# Patient Record
Sex: Male | Born: 1970
Health system: Southern US, Community
[De-identification: ages and names within clinical notes are randomized; demographics above are authoritative.]

## PROBLEM LIST (undated history)

## (undated) DIAGNOSIS — G4730 Sleep apnea, unspecified: Secondary | ICD-10-CM

## (undated) DIAGNOSIS — F1910 Other psychoactive substance abuse, uncomplicated: Secondary | ICD-10-CM

## (undated) DIAGNOSIS — F32A Depression, unspecified: Secondary | ICD-10-CM

## (undated) DIAGNOSIS — F329 Major depressive disorder, single episode, unspecified: Secondary | ICD-10-CM

## (undated) DIAGNOSIS — B001 Herpesviral vesicular dermatitis: Secondary | ICD-10-CM

## (undated) DIAGNOSIS — F419 Anxiety disorder, unspecified: Secondary | ICD-10-CM

## (undated) HISTORY — DX: Major depressive disorder, single episode, unspecified: F32.9

## (undated) HISTORY — DX: Depression, unspecified: F32.A

## (undated) HISTORY — DX: Anxiety disorder, unspecified: F41.9

## (undated) HISTORY — DX: Herpesviral vesicular dermatitis: B00.1

## (undated) HISTORY — PX: HERNIA REPAIR: SHX51

## (undated) HISTORY — DX: Sleep apnea, unspecified: G47.30

---

## 1990-02-24 HISTORY — PX: ANTERIOR CRUCIATE LIGAMENT REPAIR: SHX115

## 2005-01-01 ENCOUNTER — Ambulatory Visit: Payer: Self-pay | Admitting: Family Medicine

## 2006-01-18 ENCOUNTER — Emergency Department (HOSPITAL_COMMUNITY): Admission: EM | Admit: 2006-01-18 | Discharge: 2006-01-18 | Payer: Self-pay | Admitting: Family Medicine

## 2006-05-12 ENCOUNTER — Ambulatory Visit: Payer: Self-pay | Admitting: Family Medicine

## 2006-08-11 ENCOUNTER — Emergency Department (HOSPITAL_COMMUNITY): Admission: EM | Admit: 2006-08-11 | Discharge: 2006-08-11 | Payer: Self-pay | Admitting: Emergency Medicine

## 2006-08-11 ENCOUNTER — Telehealth (INDEPENDENT_AMBULATORY_CARE_PROVIDER_SITE_OTHER): Payer: Self-pay | Admitting: *Deleted

## 2007-03-31 ENCOUNTER — Ambulatory Visit: Payer: Self-pay | Admitting: Family Medicine

## 2007-03-31 DIAGNOSIS — F329 Major depressive disorder, single episode, unspecified: Secondary | ICD-10-CM

## 2007-03-31 DIAGNOSIS — Q549 Hypospadias, unspecified: Secondary | ICD-10-CM | POA: Insufficient documentation

## 2007-03-31 DIAGNOSIS — J069 Acute upper respiratory infection, unspecified: Secondary | ICD-10-CM | POA: Insufficient documentation

## 2008-01-28 ENCOUNTER — Ambulatory Visit: Payer: Self-pay | Admitting: Internal Medicine

## 2008-01-28 DIAGNOSIS — B9789 Other viral agents as the cause of diseases classified elsewhere: Secondary | ICD-10-CM | POA: Insufficient documentation

## 2008-02-01 ENCOUNTER — Telehealth (INDEPENDENT_AMBULATORY_CARE_PROVIDER_SITE_OTHER): Payer: Self-pay | Admitting: Internal Medicine

## 2008-02-03 ENCOUNTER — Ambulatory Visit: Payer: Self-pay | Admitting: Family Medicine

## 2008-02-11 ENCOUNTER — Telehealth (INDEPENDENT_AMBULATORY_CARE_PROVIDER_SITE_OTHER): Payer: Self-pay | Admitting: Internal Medicine

## 2008-02-11 ENCOUNTER — Encounter (INDEPENDENT_AMBULATORY_CARE_PROVIDER_SITE_OTHER): Payer: Self-pay | Admitting: Internal Medicine

## 2008-02-11 ENCOUNTER — Ambulatory Visit (HOSPITAL_COMMUNITY): Admission: RE | Admit: 2008-02-11 | Discharge: 2008-02-11 | Payer: Self-pay | Admitting: Family Medicine

## 2008-02-29 ENCOUNTER — Ambulatory Visit: Payer: Self-pay | Admitting: Family Medicine

## 2008-02-29 DIAGNOSIS — M549 Dorsalgia, unspecified: Secondary | ICD-10-CM | POA: Insufficient documentation

## 2009-02-14 ENCOUNTER — Ambulatory Visit: Payer: Self-pay | Admitting: Family Medicine

## 2009-02-14 DIAGNOSIS — B070 Plantar wart: Secondary | ICD-10-CM

## 2009-02-14 DIAGNOSIS — J019 Acute sinusitis, unspecified: Secondary | ICD-10-CM | POA: Insufficient documentation

## 2009-03-30 ENCOUNTER — Encounter: Payer: Self-pay | Admitting: Family Medicine

## 2010-03-26 NOTE — Letter (Signed)
Summary: [facility]  [facility]   Imported By: Edmonia James 04/05/2009 10:28:19  _____________________________________________________________________  External Attachment:    Type:   Image     Comment:   External Document

## 2010-07-15 ENCOUNTER — Telehealth: Payer: Self-pay | Admitting: *Deleted

## 2010-07-15 ENCOUNTER — Encounter: Payer: Self-pay | Admitting: Family Medicine

## 2010-07-15 ENCOUNTER — Ambulatory Visit (INDEPENDENT_AMBULATORY_CARE_PROVIDER_SITE_OTHER): Payer: 59 | Admitting: Family Medicine

## 2010-07-15 ENCOUNTER — Ambulatory Visit (INDEPENDENT_AMBULATORY_CARE_PROVIDER_SITE_OTHER)
Admission: RE | Admit: 2010-07-15 | Discharge: 2010-07-15 | Disposition: A | Discharge status: Home / Self Care | Payer: 59 | Source: Ambulatory Visit | Attending: Family Medicine | Admitting: Family Medicine

## 2010-07-15 VITALS — BP 116/72 | HR 76 | Temp 97.5°F | Ht 72.0 in | Wt 200.0 lb

## 2010-07-15 DIAGNOSIS — M545 Low back pain, unspecified: Secondary | ICD-10-CM

## 2010-07-15 MED ORDER — HYDROCODONE-ACETAMINOPHEN 5-500 MG PO TABS: 1.0000 | ORAL_TABLET | Freq: Four times a day (QID) | ORAL | Status: DC | PRN

## 2010-07-15 MED ORDER — METHOCARBAMOL 500 MG PO TABS: 500.0000 mg | ORAL_TABLET | Freq: Four times a day (QID) | ORAL | Status: AC | PRN

## 2010-07-15 NOTE — Assessment & Plan Note (Signed)
Recurrent - suspect weak musculature and spasm Some sciatic symptoms on R LS film today Robaxin and naproxen vicodin if needed Start PT exercises given at last visit  Update Considering chiropractor  Declines PT at this time Red flags- weakness/ numbness-will call

## 2010-07-15 NOTE — Patient Instructions (Signed)
Work on your back exercises at home X ray today  Trial of robaxin , also vicodin-- watch for sedation Continue heat  Stay in motion as much as you can but no heavy lifting  We will update you with result

## 2010-07-15 NOTE — Telephone Encounter (Signed)
Triage Record Num: [id number] Operator: Leota Sauers Patient Name: Eric Blackburn Call Date & Time: 07/14/2010 1:04:08PM Patient Phone: [phone] PCP: Patient Gender: Male PCP Fax : Patient DOB: 12-30-1970 Practice Name: [facility] Reason for Call: Pt. calling about lower back pain; onset 5/12. States he has been taking Naproxen 500 up to TID, ice and then heat. ES Tylenol TID for pain. Pain rated up to 9/10 - shooting pains; dull ache at 4 in between. Per standing orders called in Flexeril 5 mg TID until next business day. Uses [facility] [phone]. Advised home care measures and to follow up w/ PCP within 24 hours per Back Sx. protocol. Protocol(s) Used: Back Symptoms Recommended Outcome per Protocol: See Provider within 24 hours Reason for Outcome: Back pain radiates into thigh or below knee Care Advice: ~ Avoid heavy lifting, bending and twisting of the back, and prolonged sitting until evaluated by provider. Apply a cloth-covered cold or ice pack to the area for 20 minutes 4 to 8 times a day for relief of pain for the first 24-48 hours. After 24 to 48 hours of cold application, use a cloth-covered heat pack to the area for 20 minutes 3 to 4 times a day. ~ Go to the ED if you have worsening pain, numbness or weakness of arms or legs, cannot walk, or have new unexplained changes in bladder or bowel function. Another adult should drive. ~ ~ Avoid activity that causes or worsens symptoms. 07/14/2010 1:20:29PM Page 1 of 1 [id number]

## 2010-07-15 NOTE — Progress Notes (Signed)
Subjective:    Patient ID: Eric Blackburn, male    DOB: [birth year], 40 y.o.   MRN: [id number]  HPI Here for recurrence of low back pain  Happened after lifting a game table on 5/12   --it hurt right away - not agonizing- but got worse  This is lasting longer than usual  This has been a week and not improving at all   No numb/ weak More pains down his leg-- this time R side  More shooting / stabbing pains-- wraps around to low abd  Sitting and then standing - stiff and getting up in am  Back with knees bent is the best  No bowel or bladder change   Does not do physical tx exercises at home Lost 8-10 lb  Doing sit ups and push ups - past year   Using heating pad and lying on floor - cannot find a comfortable position   Has had low back pain on and off for years  Last saw dr Lorelei Pont in winter tx with home PT exercises and flexeril/ diclofenac/ vicodin  Has not had x ray of his back   Chiropractor helped years ago    Has been taking naproxen 500 tid  Flexeril not really helping --- in past robaxin- ? If worked better  [employer] some tramadol at work   Past Medical History  Diagnosis Date  . Depression     History   Social History  . Marital Status: Married    Spouse Name: N/A    Number of Children: N/A  . Years of Education: N/A   Occupational History  . Not on file.   Social History Main Topics  . Smoking status: Never Smoker   . Smokeless tobacco: Not on file  . Alcohol Use: Not on file  . Drug Use: Not on file  . Sexually Active: Not on file   Other Topics Concern  . Not on file   Social History Narrative  . No narrative on file         Review of Systems Review of Systems  Constitutional: Negative for fever, appetite change, fatigue and unexpected weight change.  Eyes: Negative for pain and visual disturbance.  Respiratory: Negative for cough and shortness of breath.   Cardiovascular: Negative.for cp   Gastrointestinal: Negative for nausea,  diarrhea and constipation.  Genitourinary: Negative for urgency and frequency.  Skin: Negative for pallor.or rash  MSK pos for back and leg pain, no joint swelling  Neurological: Negative for weakness, light-headedness, numbness and headaches.  Hematological: Negative for adenopathy. Does not bruise/bleed easily.  Psychiatric/Behavioral: Negative for dysphoric mood. The patient is not nervous/anxious.          Objective:   Physical Exam  Constitutional: He appears well-developed and well-nourished. No distress.  HENT:  Head: Normocephalic and atraumatic.  Eyes: Conjunctivae and EOM are normal. Pupils are equal, round, and reactive to light.  Neck: Normal range of motion. Neck supple. No thyromegaly present.       No CS tenderness   Cardiovascular: Normal rate, regular rhythm and normal heart sounds.   Pulmonary/Chest: Effort normal and breath sounds normal. No respiratory distress. He has no wheezes.  Abdominal: He exhibits no mass. There is no tenderness.  Musculoskeletal: He exhibits no edema and no tenderness.       No LS tenderness Flex 20 deg and ext 10 deg Pain to flex R and twist R Some tenderness of R perilumbar and buttock musculature SLR causes lumbar  pain on R No neurol deficits  Lymphadenopathy:    He has no cervical adenopathy.  Neurological: He is alert. He has normal strength and normal reflexes. No sensory deficit.  Skin: Skin is warm and dry. No rash noted. No erythema. No pallor.  Psychiatric: He has a normal mood and affect.          Assessment & Plan:

## 2010-07-18 ENCOUNTER — Telehealth: Payer: Self-pay | Admitting: Family Medicine

## 2010-07-18 DIAGNOSIS — M545 Low back pain, unspecified: Secondary | ICD-10-CM

## 2010-07-18 NOTE — Telephone Encounter (Signed)
Message copied by Abner Greenspan on Thu Jul 18, 2010  4:39 PM ------      Message from: Royann Shivers      Created: Thu Jul 18, 2010  2:25 PM       Patient notified and is fine with going to ortho. He didn't have a preference as he didn't really know who specialized in backs. I told him we would do referral and give him a call. He verbalized understanding.

## 2010-07-18 NOTE — Telephone Encounter (Signed)
Will do ref for [employer]

## 2010-07-19 ENCOUNTER — Telehealth: Payer: Self-pay | Admitting: *Deleted

## 2010-07-19 NOTE — Telephone Encounter (Signed)
I doubt that - but the orthopedic Dr will be able to answer that question more accurately after reviewing his films No obvious signs of old fracture

## 2010-07-19 NOTE — Telephone Encounter (Signed)
Patient notified as instructed by telephone. Pt wondered if there was any fracture at all new or old. Dr Glori Bickers said none seen and the defect in the spine could possible be from trauma that slid the disc forward over the other and Dr Glori Bickers wanted him to see orthopedist to see if anything could be done to help pain possible PT. Pt was appreciative and will wait to see ortho.

## 2010-07-19 NOTE — Telephone Encounter (Signed)
Pt called with question regarding x-ray results.  He is asking if he has an old fracture that has healed.

## 2010-07-19 NOTE — Telephone Encounter (Signed)
Appt made Dr Lorin Mercy on 07/25/2010 at 3:30 pt notified. Eric Blackburn

## 2010-09-11 ENCOUNTER — Other Ambulatory Visit: Payer: Self-pay | Admitting: *Deleted

## 2010-09-11 MED ORDER — VALACYCLOVIR HCL 1 G PO TABS: ORAL_TABLET | ORAL | Status: DC

## 2010-09-11 NOTE — Telephone Encounter (Signed)
Patient notified as instructed by telephone. 

## 2010-09-11 NOTE — Telephone Encounter (Signed)
Sent electronically 

## 2010-09-11 NOTE — Telephone Encounter (Signed)
Patient says that he has a couple of cold sores and is asking if he could get this refilled. Uses [facility].

## 2010-12-11 LAB — POCT RAPID STREP A: Streptococcus, Group A Screen (Direct): NEGATIVE

## 2011-02-03 ENCOUNTER — Encounter (HOSPITAL_COMMUNITY): Payer: Self-pay | Admitting: Emergency Medicine

## 2011-02-03 ENCOUNTER — Other Ambulatory Visit: Payer: Self-pay | Admitting: Internal Medicine

## 2011-02-03 ENCOUNTER — Emergency Department (HOSPITAL_COMMUNITY)
Admission: EM | Admit: 2011-02-03 | Discharge: 2011-02-03 | Disposition: A | Discharge status: Home / Self Care | Payer: 59 | Source: Home / Self Care

## 2011-02-03 DIAGNOSIS — M545 Low back pain: Secondary | ICD-10-CM

## 2011-02-03 MED ORDER — CYCLOBENZAPRINE HCL 10 MG PO TABS: 10.0000 mg | ORAL_TABLET | Freq: Three times a day (TID) | ORAL | Status: DC | PRN

## 2011-02-03 MED ORDER — KETOROLAC TROMETHAMINE 60 MG/2ML IM SOLN
60.0000 mg | Freq: Once | INTRAMUSCULAR | Status: AC
  Administered 2011-02-03: 60 mg via INTRAMUSCULAR

## 2011-02-03 MED ORDER — CYCLOBENZAPRINE HCL 10 MG PO TABS: 10.0000 mg | ORAL_TABLET | Freq: Three times a day (TID) | ORAL | Status: AC | PRN

## 2011-02-03 MED ORDER — OXYCODONE-ACETAMINOPHEN 5-325 MG PO TABS: 1.0000 | ORAL_TABLET | ORAL | Status: AC | PRN

## 2011-02-03 MED ORDER — KETOROLAC TROMETHAMINE 60 MG/2ML IM SOLN
INTRAMUSCULAR | Status: AC
  Filled 2011-02-03: qty 2

## 2011-02-03 MED ORDER — NAPROXEN 500 MG PO TABS: 500.0000 mg | ORAL_TABLET | Freq: Two times a day (BID) | ORAL | Status: DC

## 2011-02-03 NOTE — ED Notes (Signed)
Pt here with bialt lower back pain shooting down bialt legs more on right sharp constant achy pain that flared up x 2dys ago.pt ahs hx chronic back pain diag with pars defect x 65mnths ago seen by dr.yates orthopedic and given prescribed pain meds.sx worsens with pressure and bending.pt has been taking naprosyn and using heating pad for discomfort and percocet.

## 2011-02-03 NOTE — Telephone Encounter (Signed)
Patient notified as instructed by telephone. Pt said he has already been seen at [facility] and got Flexeril and pain med. I advised pt to give [place] a call if we could be of assistance. Pt did not want med called in.

## 2011-02-03 NOTE — Telephone Encounter (Signed)
Patient called and stated he strained his lower back again and is having trouble sleeping and is in a lot of pain.  He wanted to know if Rx could be called in for pain and muscle relaxer.  Patient is at Urgent care and he stated it is crowded and if we can get this done and then call him on his cell.  Please advise.

## 2011-02-03 NOTE — Telephone Encounter (Signed)
I can do a muscle relaxer to get him by until he can get appt here  Cannot do narcotic however Will try flexeril with caution Naproxen prn bid with food  Warm compresses prn

## 2011-02-03 NOTE — ED Provider Notes (Signed)
History     CSN: [id number] Arrival date & time: 02/03/2011  1:43 PM   None     Chief Complaint  Patient presents with  . Back Pain    (Consider location/radiation/quality/duration/timing/severity/associated sxs/prior treatment) HPI Comments: Hx of LBP. Was diagnosed with Pars Defect by Dr Lorin Mercy. Pain returned 2 days ago - same pain as has had previously. Pt states he had done some heavy lifting. States he has a flare up of his back pain a couple times a year. In the past has taken Hydrocodone (or Percocet)  and Robaxin (or Flexeril)  and has worked well for him. This weekend was using Percocet and also provided relief. Pain radiates down bilat legs, Rt > Lt. No numbness or tingling. No loss of bowel or bladder control.   Patient is a 40 y.o. male presenting with back pain. The history is provided by the patient.  Back Pain  Pertinent negatives include no chest pain, no fever, no abdominal pain and no dysuria.    Past Medical History  Diagnosis Date  . Depression     Past Surgical History  Procedure Date  . Hernia repair     x 2 as a child  . Anterior cruciate ligament repair 1992    Family History  Problem Relation Age of Onset  . Depression Mother     History  Substance Use Topics  . Smoking status: Never Smoker   . Smokeless tobacco: Not on file  . Alcohol Use: Yes      Review of Systems  Constitutional: Negative for fever and chills.  Respiratory: Negative for cough and shortness of breath.   Cardiovascular: Negative for chest pain.  Gastrointestinal: Negative for abdominal pain, diarrhea and constipation.  Genitourinary: Negative for dysuria and frequency.  Musculoskeletal: Positive for back pain.    Allergies  Review of patient's allergies indicates no known allergies.  Home Medications   Current Outpatient Rx  Name Route Sig Dispense Refill  . BUPROPION HCL ER (XL) 300 MG PO TB24 Oral Take 300 mg by mouth daily.      Marland Kitchen CALCIUM-VITAMIN D3 500-400  MG-UNIT PO TABS Oral Take 1 tablet by mouth daily.      . CYCLOBENZAPRINE HCL 10 MG PO TABS Oral Take 1 tablet (10 mg total) by mouth every 8 (eight) hours as needed for muscle spasms. 30 tablet 0  . ESCITALOPRAM OXALATE 10 MG PO TABS Oral Take 10 mg by mouth daily.      Marland Kitchen ESCITALOPRAM OXALATE 20 MG PO TABS Oral Take 20 mg by mouth daily.      Marland Kitchen HYDROCODONE-ACETAMINOPHEN 5-500 MG PO TABS Oral Take 1 tablet by mouth every 6 (six) hours as needed for pain (watch for sedation). 30 tablet 0  . MULTIVITAMINS PO CAPS Oral Take 1 capsule by mouth daily.      Marland Kitchen NAPROXEN 500 MG PO TABS Oral Take 1 tablet (500 mg total) by mouth 2 (two) times daily with a meal. 60 tablet 0  . FISH OIL 1000 MG PO CAPS Oral Take 1 capsule by mouth daily.     Marland Kitchen VALACYCLOVIR HCL 1 G PO TABS  2 tablets by mouth two times a day for 1 day for cold sore as needed. 4 tablet 3    BP 114/79  Pulse 74  Temp(Src) 97.7 F (36.5 C) (Oral)  Resp 24  SpO2 99%  Physical Exam  Nursing note and vitals reviewed. Constitutional: He is oriented to person, place, and time. He  appears well-developed and well-nourished.       NAD, but does appear uncomfortable and is pacing the exam room.   Cardiovascular: Normal rate, regular rhythm and normal heart sounds.   Pulmonary/Chest: Effort normal and breath sounds normal. No respiratory distress.  Musculoskeletal:       Lumbar back: He exhibits decreased range of motion and tenderness. He exhibits no bony tenderness, no swelling and no spasm.       Back:  Neurological: He is alert and oriented to person, place, and time. He has normal strength. No sensory deficit. Gait normal.  Reflex Scores:      Patellar reflexes are 2+ on the right side and 2+ on the left side. Skin: Skin is warm and dry.  Psychiatric: He has a normal mood and affect.    ED Course  Procedures (including critical care time)  Labs Reviewed - No data to display No results found.   No diagnosis found.    [place], [place] 02/03/11 [phone]

## 2011-02-04 NOTE — ED Provider Notes (Signed)
Dx low back pain, sent home with percocet, flexaril. Is to f/u with Dr. Narda Amber Medical screening examination/treatment/procedure(s) were performed by non-physician practitioner and as supervising physician I was immediately available for consultation/collaboration.  Cherly Beach MD   Cherly Beach, MD 02/04/11 1800

## 2012-01-27 ENCOUNTER — Other Ambulatory Visit: Payer: Self-pay | Admitting: *Deleted

## 2012-01-27 MED ORDER — VALACYCLOVIR HCL 1 G PO TABS: ORAL_TABLET | ORAL | Status: DC

## 2012-01-27 NOTE — Telephone Encounter (Signed)
Rx filled and pt notified  

## 2012-01-27 NOTE — Telephone Encounter (Signed)
Please give 5 refils-thanks

## 2012-01-27 NOTE — Telephone Encounter (Signed)
Pt came to do the pharm. inspection and asked me to see if you would refill med, please advise

## 2013-01-03 ENCOUNTER — Encounter: Payer: Self-pay | Admitting: Family Medicine

## 2013-04-25 ENCOUNTER — Other Ambulatory Visit: Payer: 59

## 2013-05-02 ENCOUNTER — Encounter: Payer: 59 | Admitting: Family Medicine

## 2013-05-16 ENCOUNTER — Telehealth: Payer: Self-pay | Admitting: Family Medicine

## 2013-05-16 DIAGNOSIS — Z0000 Encounter for general adult medical examination without abnormal findings: Secondary | ICD-10-CM | POA: Insufficient documentation

## 2013-05-16 NOTE — Telephone Encounter (Signed)
Message copied by Abner Greenspan on Mon May 16, 2013  6:11 PM ------      Message from: Ellamae Sia      Created: Mon May 09, 2013 12:12 PM      Regarding: Lab orders for Wednesday, 3.25.15       Patient is scheduled for CPX labs, please order future labs, Thanks , Terri       ------

## 2013-05-18 ENCOUNTER — Other Ambulatory Visit (INDEPENDENT_AMBULATORY_CARE_PROVIDER_SITE_OTHER): Payer: 59

## 2013-05-18 DIAGNOSIS — Z0000 Encounter for general adult medical examination without abnormal findings: Secondary | ICD-10-CM

## 2013-05-18 LAB — COMPREHENSIVE METABOLIC PANEL
ALBUMIN: 4.2 g/dL (ref 3.5–5.2)
ALT: 25 U/L (ref 0–53)
AST: 24 U/L (ref 0–37)
Alkaline Phosphatase: 44 U/L (ref 39–117)
BILIRUBIN TOTAL: 0.6 mg/dL (ref 0.3–1.2)
BUN: 11 mg/dL (ref 6–23)
CALCIUM: 9.2 mg/dL (ref 8.4–10.5)
CO2: 29 mEq/L (ref 19–32)
Chloride: 102 mEq/L (ref 96–112)
Creatinine, Ser: 1 mg/dL (ref 0.4–1.5)
GFR: 90.46 mL/min (ref >60.00)
GLUCOSE: 96 mg/dL (ref 70–99)
Potassium: 4.2 mEq/L (ref 3.5–5.1)
Sodium: 136 mEq/L (ref 135–145)
Total Protein: 6.8 g/dL (ref 6.0–8.3)

## 2013-05-18 LAB — LIPID PANEL
Cholesterol: 182 mg/dL (ref 0–200)
HDL: 60.5 mg/dL (ref >39.00)
LDL Cholesterol: 113 mg/dL — ABNORMAL HIGH (ref 0–99)
TRIGLYCERIDES: 44 mg/dL (ref 0.0–149.0)
Total CHOL/HDL Ratio: 3
VLDL: 8.8 mg/dL (ref 0.0–40.0)

## 2013-05-18 LAB — CBC WITH DIFFERENTIAL/PLATELET
BASOS PCT: 0.5 % (ref 0.0–3.0)
Basophils Absolute: 0 10*3/uL (ref 0.0–0.1)
Eosinophils Absolute: 0.1 10*3/uL (ref 0.0–0.7)
Eosinophils Relative: 2.7 % (ref 0.0–5.0)
HEMATOCRIT: 40.8 % (ref 39.0–52.0)
HEMOGLOBIN: 13.4 g/dL (ref 13.0–17.0)
LYMPHS ABS: 1.5 10*3/uL (ref 0.7–4.0)
LYMPHS PCT: 32.3 % (ref 12.0–46.0)
MCHC: 32.9 g/dL (ref 30.0–36.0)
MCV: 93 fl (ref 78.0–100.0)
MONO ABS: 0.4 10*3/uL (ref 0.1–1.0)
MONOS PCT: 9 % (ref 3.0–12.0)
NEUTROS ABS: 2.6 10*3/uL (ref 1.4–7.7)
Neutrophils Relative %: 55.5 % (ref 43.0–77.0)
PLATELETS: 212 10*3/uL (ref 150.0–400.0)
RBC: 4.38 Mil/uL (ref 4.22–5.81)
RDW: 13.3 % (ref 11.5–14.6)
WBC: 4.6 10*3/uL (ref 4.5–10.5)

## 2013-05-18 LAB — TSH: TSH: 1.39 u[IU]/mL (ref 0.35–5.50)

## 2013-05-25 ENCOUNTER — Ambulatory Visit (INDEPENDENT_AMBULATORY_CARE_PROVIDER_SITE_OTHER): Payer: 59 | Admitting: Family Medicine

## 2013-05-25 ENCOUNTER — Encounter: Payer: Self-pay | Admitting: Family Medicine

## 2013-05-25 VITALS — BP 110/70 | HR 62 | Temp 97.9°F | Ht 71.14 in | Wt 200.8 lb

## 2013-05-25 DIAGNOSIS — F329 Major depressive disorder, single episode, unspecified: Secondary | ICD-10-CM

## 2013-05-25 DIAGNOSIS — Z23 Encounter for immunization: Secondary | ICD-10-CM

## 2013-05-25 DIAGNOSIS — G4710 Hypersomnia, unspecified: Secondary | ICD-10-CM

## 2013-05-25 DIAGNOSIS — F3289 Other specified depressive episodes: Secondary | ICD-10-CM

## 2013-05-25 DIAGNOSIS — R0609 Other forms of dyspnea: Secondary | ICD-10-CM

## 2013-05-25 DIAGNOSIS — G4733 Obstructive sleep apnea (adult) (pediatric): Secondary | ICD-10-CM | POA: Insufficient documentation

## 2013-05-25 DIAGNOSIS — R0989 Other specified symptoms and signs involving the circulatory and respiratory systems: Secondary | ICD-10-CM

## 2013-05-25 DIAGNOSIS — G4730 Sleep apnea, unspecified: Secondary | ICD-10-CM

## 2013-05-25 DIAGNOSIS — R0683 Snoring: Secondary | ICD-10-CM

## 2013-05-25 DIAGNOSIS — Z0000 Encounter for general adult medical examination without abnormal findings: Secondary | ICD-10-CM

## 2013-05-25 NOTE — Progress Notes (Signed)
Subjective:    Patient ID: Eric Blackburn, male    DOB: Aug 05, 1970, 43 y.o.   MRN: [id number]  HPI Here for health maintenance exam and to review chronic medical problems    Has been doing well   Nothing new going on   ? When last Td was  Had flu shot in the fall   Mood has been really good - stable on current medicines for quite a while - he titrated dose down a bit on his own  He has been stable for a while and would like me to take over px his antidep meds when they run out next time   Results for orders placed in visit on 05/18/13  CBC WITH DIFFERENTIAL      Result Value Ref Range   WBC 4.6  4.5 - 10.5 K/uL   RBC 4.38  4.22 - 5.81 Mil/uL   Hemoglobin 13.4  13.0 - 17.0 g/dL   HCT 40.8  39.0 - 52.0 %   MCV 93.0  78.0 - 100.0 fl   MCHC 32.9  30.0 - 36.0 g/dL   RDW 13.3  11.5 - 14.6 %   Platelets 212.0  150.0 - 400.0 K/uL   Neutrophils Relative % 55.5  43.0 - 77.0 %   Lymphocytes Relative 32.3  12.0 - 46.0 %   Monocytes Relative 9.0  3.0 - 12.0 %   Eosinophils Relative 2.7  0.0 - 5.0 %   Basophils Relative 0.5  0.0 - 3.0 %   Neutro Abs 2.6  1.4 - 7.7 K/uL   Lymphs Abs 1.5  0.7 - 4.0 K/uL   Monocytes Absolute 0.4  0.1 - 1.0 K/uL   Eosinophils Absolute 0.1  0.0 - 0.7 K/uL   Basophils Absolute 0.0  0.0 - 0.1 K/uL  COMPREHENSIVE METABOLIC PANEL      Result Value Ref Range   Sodium 136  135 - 145 mEq/L   Potassium 4.2  3.5 - 5.1 mEq/L   Chloride 102  96 - 112 mEq/L   CO2 29  19 - 32 mEq/L   Glucose, Bld 96  70 - 99 mg/dL   BUN 11  6 - 23 mg/dL   Creatinine, Ser 1.0  0.4 - 1.5 mg/dL   Total Bilirubin 0.6  0.3 - 1.2 mg/dL   Alkaline Phosphatase 44  39 - 117 U/L   AST 24  0 - 37 U/L   ALT 25  0 - 53 U/L   Total Protein 6.8  6.0 - 8.3 g/dL   Albumin 4.2  3.5 - 5.2 g/dL   Calcium 9.2  8.4 - 10.5 mg/dL   GFR 90.46  >60.00 mL/min  LIPID PANEL      Result Value Ref Range   Cholesterol 182  0 - 200 mg/dL   Triglycerides 44.0  0.0 - 149.0 mg/dL   HDL 60.50  >39.00  mg/dL   VLDL 8.8  0.0 - 40.0 mg/dL   LDL Cholesterol 113 (*) 0 - 99 mg/dL   Total CHOL/HDL Ratio 3    TSH      Result Value Ref Range   TSH 1.39  0.35 - 5.50 uIU/mL    HDL is very good - he does some dog walking 2 miles per day and getting ready for a big backpacking trip  Diet is ok - avoids fast food and eating healthier at home   MGF had prostate cancer  No frequent urination  No nocturia at all  Has hx of hypospadias -since birth - sees Dr Jeffie Pollock / has narrow urethra   Wt is stable   Drinks beer - has a drink every night / more on the weekends   Snores a lot  His wife recorded him  He has a big uvula  He has had witnessed apnea Is tired all the time  Waking up un rested   Patient Active Problem List   Diagnosis Date Noted  . Snoring 05/25/2013  . Hypersomnia with sleep apnea, unspecified 05/25/2013  . Routine general medical examination at a health care facility 05/16/2013  . LOW BACK PAIN, ACUTE 02/29/2008  . DEPRESSION 03/31/2007  . HYPOSPADIAS 03/31/2007   Past Medical History  Diagnosis Date  . Depression    Past Surgical History  Procedure Laterality Date  . Hernia repair      x 2 as a child  . Anterior cruciate ligament repair  1992   History  Substance Use Topics  . Smoking status: Never Smoker   . Smokeless tobacco: Never Used  . Alcohol Use: Yes     Comment: 1-2 drinks daily   Family History  Problem Relation Age of Onset  . Depression Mother    No Known Allergies Current Outpatient Prescriptions on File Prior to Visit  Medication Sig Dispense Refill  . escitalopram (LEXAPRO) 10 MG tablet Take 10 mg by mouth daily.        . Multiple Vitamin (MULTIVITAMIN) capsule Take 1 capsule by mouth every Monday, Wednesday, and Friday.       . naproxen (NAPROSYN) 500 MG tablet Take 1 tablet (500 mg total) by mouth 2 (two) times daily with a meal.  60 tablet  0  . valACYclovir (VALTREX) 1000 MG tablet 2 tablets by mouth two times a day for 1 day for cold  sore as needed.  4 tablet  5   No current facility-administered medications on file prior to visit.    Review of Systems Review of Systems  Constitutional: Negative for fever, appetite change,  and unexpected weight change. pos for fatigue/unrestful sleep and witnessed apnea Eyes: Negative for pain and visual disturbance.  Respiratory: Negative for cough and shortness of breath.  pos for snoring  Cardiovascular: Negative for cp or palpitations    Gastrointestinal: Negative for nausea, diarrhea and constipation.  Genitourinary: Negative for urgency and frequency. neg for nocturia  Skin: Negative for pallor or rash   Neurological: Negative for weakness, light-headedness, numbness and headaches.  Hematological: Negative for adenopathy. Does not bruise/bleed easily.  Psychiatric/Behavioral: Negative for dysphoric mood. The patient is not nervous/anxious.         Objective:   Physical Exam  Constitutional: He appears well-developed and well-nourished. No distress.  HENT:  Head: Normocephalic and atraumatic.  Right Ear: External ear normal.  Left Ear: External ear normal.  Nose: Nose normal.  Mouth/Throat: Oropharynx is clear and moist.  Eyes: Conjunctivae and EOM are normal. Pupils are equal, round, and reactive to light. Right eye exhibits no discharge. Left eye exhibits no discharge. No scleral icterus.  Neck: Normal range of motion. Neck supple. No JVD present. Carotid bruit is not present. No thyromegaly present.  Cardiovascular: Normal rate, regular rhythm, normal heart sounds and intact distal pulses.  Exam reveals no gallop.   Pulmonary/Chest: Effort normal and breath sounds normal. No respiratory distress. He has no wheezes. He exhibits no tenderness.  Abdominal: Soft. Bowel sounds are normal. He exhibits no distension, no abdominal bruit and no mass. There is  no tenderness.  Musculoskeletal: He exhibits no edema and no tenderness.  Lymphadenopathy:    He has no cervical  adenopathy.  Neurological: He is alert. He has normal reflexes. No cranial nerve deficit. He exhibits normal muscle tone. Coordination normal.  Skin: Skin is warm and dry. No rash noted. No erythema. No pallor.  Psychiatric: He has a normal mood and affect.  Cheerful and talkative           Assessment & Plan:

## 2013-05-25 NOTE — Progress Notes (Signed)
Pre visit review using our clinic review tool, if applicable. No additional management support is needed unless otherwise documented below in the visit note. 

## 2013-05-25 NOTE — Patient Instructions (Addendum)
Tetanus shot today  Take care of yourself  Labs are stable  Avoid red meat/ fried foods/ egg yolks/ fatty breakfast meats/ butter, cheese and high fat dairy/ and shellfish   Please stop up front on the way out for your sleep clinic referral

## 2013-05-26 ENCOUNTER — Other Ambulatory Visit: Payer: Self-pay

## 2013-05-26 MED ORDER — VALACYCLOVIR HCL 1 G PO TABS
ORAL_TABLET | ORAL | Status: DC
Start: 2013-05-26 — End: 2013-06-05

## 2013-05-26 NOTE — Assessment & Plan Note (Signed)
With witnessed apnea and fatigue  Ref to sleep clinic-eval for sleep apnea

## 2013-05-26 NOTE — Assessment & Plan Note (Signed)
Reviewed health habits including diet and exercise and skin cancer prevention Reviewed appropriate screening tests for age  Also reviewed health mt list, fam hx and immunization status , as well as social and family history   Labs reviewed  

## 2013-05-26 NOTE — Assessment & Plan Note (Signed)
Ref to sleep clinic in pulm office to disc sleep study  Symptoms consistent with apnea and wife has witnessed it

## 2013-05-26 NOTE — Telephone Encounter (Signed)
Pt left v/m requesting refill valtrex to [facility].Please advise. Dr Glori Bickers is possibley not on computer. Pt request cb when refilled.

## 2013-05-26 NOTE — Telephone Encounter (Signed)
plz notify sent in. 

## 2013-05-26 NOTE — Assessment & Plan Note (Signed)
Prev seen by psychiatry- but now stable on lower doses of ssri and buproprion When his current supply runs out we will take over px  No problems-doing well emph imp of tx of sleep apnea and also self care

## 2013-05-30 NOTE — Telephone Encounter (Signed)
Patient notified by telephone that script has been sent to the pharmacy. 

## 2013-06-05 ENCOUNTER — Telehealth: Payer: Self-pay | Admitting: Family Medicine

## 2013-06-05 MED ORDER — VALACYCLOVIR HCL 1 G PO TABS: ORAL_TABLET | ORAL | Status: DC

## 2013-06-05 NOTE — Telephone Encounter (Signed)
Pt needs refill of valtrex to [facility]

## 2013-07-04 ENCOUNTER — Encounter: Payer: Self-pay | Admitting: Family Medicine

## 2013-07-05 MED ORDER — BUPROPION HCL ER (XL) 150 MG PO TB24: 150.0000 mg | ORAL_TABLET | Freq: Every day | ORAL | Status: DC

## 2013-07-05 MED ORDER — ESCITALOPRAM OXALATE 10 MG PO TABS: 10.0000 mg | ORAL_TABLET | Freq: Every day | ORAL | Status: DC

## 2013-07-05 NOTE — Telephone Encounter (Signed)
Will refill electronically  

## 2013-07-07 ENCOUNTER — Encounter: Payer: Self-pay | Admitting: Internal Medicine

## 2013-07-07 ENCOUNTER — Ambulatory Visit (INDEPENDENT_AMBULATORY_CARE_PROVIDER_SITE_OTHER): Payer: 59 | Admitting: Internal Medicine

## 2013-07-07 VITALS — BP 118/70 | HR 67 | Ht 72.0 in | Wt 206.8 lb

## 2013-07-07 DIAGNOSIS — G4730 Sleep apnea, unspecified: Principal | ICD-10-CM

## 2013-07-07 DIAGNOSIS — G4710 Hypersomnia, unspecified: Secondary | ICD-10-CM

## 2013-07-07 DIAGNOSIS — G4733 Obstructive sleep apnea (adult) (pediatric): Secondary | ICD-10-CM

## 2013-07-07 NOTE — Patient Instructions (Signed)
Order- Unattended home sleep study    Dx OSA  Please call as needed

## 2013-07-07 NOTE — Progress Notes (Signed)
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 No sleep study His wife tells him he snores loudly; he wakes himself ER and worse on his back. Weight is stable. Daytime sleepiness-2 or 3 caffeine drinks per day. Bedtime 10 or 11 PM, sleep latency 20 minutes, waking 4 or 5 times before up at 5:45 AM No leg jerks or other unusual activity. No ENT surgery. Good general health.  Prior to Admission medications   Medication Sig Start Date End Date Taking? Authorizing Provider  buPROPion (WELLBUTRIN XL) 150 MG 24 hr tablet Take 1 tablet (150 mg total) by mouth daily. 07/05/13  Yes Abner Greenspan, MD  escitalopram (LEXAPRO) 10 MG tablet Take 1 tablet (10 mg total) by mouth daily. 07/05/13  Yes Abner Greenspan, MD  ibuprofen (ADVIL,MOTRIN) 200 MG tablet Take 400-800 mg by mouth every 6 (six) hours as needed.   Yes Historical Provider, MD  Multiple Vitamin (MULTIVITAMIN) capsule Take 1 capsule by mouth every Monday, Wednesday, and Friday.    Yes Historical Provider, MD  valACYclovir (VALTREX) 1000 MG tablet 2 tablets by mouth two times a day for 1 day for cold sore as needed. 06/05/13  Yes Abner Greenspan, MD   Past Medical History  Diagnosis Date  . Depression    Past Surgical History  Procedure Laterality Date  . Hernia repair      x 2 as a child  . Anterior cruciate ligament repair  1992    Right   Family History  Problem Relation Age of Onset  . Depression Mother   . Emphysema Maternal Grandfather   . Asthma Sister     developed as a child  . Prostate cancer Maternal Grandfather   . Cancer Paternal Grandmother    History   Social History  . Marital Status: Married    Spouse Name: N/A    Number of Children: 2  . Years of Education: N/A   Occupational History  . pharmacist [employer]   Social History Main Topics  . Smoking status: Never Smoker   . Smokeless tobacco: Never Used  . Alcohol Use: Yes     Comment: 1-4 drinks daily (beer)   . Drug Use: No  . Sexual Activity: Not on file   Other Topics Concern  . Not on file   Social History Narrative  . No narrative on file   ROS-see HPI Constitutional:   No-   weight loss, night sweats, fevers, chills,+ fatigue, lassitude. HEENT:   No-  headaches, difficulty swallowing, tooth/dental problems, sore throat,       No-  sneezing, itching, ear ache, nasal congestion, post nasal drip,  CV:  No-   chest pain, orthopnea, PND, swelling in lower extremities, anasarca,                                  dizziness, palpitations Resp: No-   shortness of breath with exertion or at rest.              No-   productive cough,  No non-productive cough,  No- coughing up of blood.              No-   change in color of mucus.  No- wheezing.   Skin: No-   rash or lesions. GI:  No-   heartburn, indigestion, abdominal pain, nausea, vomiting, diarrhea,  change in bowel habits, loss of appetite GU: No-   dysuria, change in color of urine, no urgency or frequency.  No- flank pain. MS:  No-   joint pain or swelling.  No- decreased range of motion.  No- back pain. Neuro-     nothing unusual Psych:  No- change in mood or affect. + depression or anxiety.  No memory loss.  OBJ- Physical Exam General- Alert, Oriented, Affect-appropriate, Distress- none acute, medium build Skin- rash-none, lesions- none, excoriation- none Lymphadenopathy- none Head- atraumatic            Eyes- Gross vision intact, PERRLA, conjunctivae and secretions clear            Ears- Hearing, canals-normal            Nose- Clear, no-Septal dev, mucus, polyps, erosion, perforation             Throat- +Small mandible, Mallampati III , mucosa clear , drainage- none, tonsils- atrophic Neck- flexible , trachea midline, no stridor , thyroid nl, carotid no bruit Chest - symmetrical excursion , unlabored           Heart/CV- RRR , no murmur , no gallop  , no rub, nl s1 s2                           - JVD- none , edema-  none, stasis changes- none, varices- none           Lung- clear to P&A, wheeze- none, cough- none , dullness-none, rub- none           Chest wall-  Abd- tender-no, distended-no, bowel sounds-present, HSM- no Br/ Gen/ Rectal- Not done, not indicated Extrem- cyanosis- none, clubbing, none, atrophy- none, strength- nl Neuro- grossly intact to observation

## 2013-07-24 DIAGNOSIS — G4730 Sleep apnea, unspecified: Secondary | ICD-10-CM

## 2013-07-24 DIAGNOSIS — G4710 Hypersomnia, unspecified: Secondary | ICD-10-CM

## 2013-07-26 ENCOUNTER — Encounter: Payer: Self-pay | Admitting: Internal Medicine

## 2013-07-26 DIAGNOSIS — G4710 Hypersomnia, unspecified: Secondary | ICD-10-CM

## 2013-07-26 DIAGNOSIS — G4730 Sleep apnea, unspecified: Secondary | ICD-10-CM

## 2013-08-21 NOTE — Assessment & Plan Note (Signed)
History and exam indicate fairly high probability for obstructive sleep apnea Plan-education done, sleep study scheduled

## 2013-08-23 ENCOUNTER — Ambulatory Visit (INDEPENDENT_AMBULATORY_CARE_PROVIDER_SITE_OTHER): Payer: 59 | Admitting: Internal Medicine

## 2013-08-23 ENCOUNTER — Encounter: Payer: Self-pay | Admitting: Internal Medicine

## 2013-08-23 VITALS — BP 102/70 | HR 52 | Ht 71.0 in | Wt 209.0 lb

## 2013-08-23 DIAGNOSIS — G4730 Sleep apnea, unspecified: Principal | ICD-10-CM

## 2013-08-23 DIAGNOSIS — G4733 Obstructive sleep apnea (adult) (pediatric): Secondary | ICD-10-CM

## 2013-08-23 DIAGNOSIS — G4710 Hypersomnia, unspecified: Secondary | ICD-10-CM

## 2013-08-23 NOTE — Progress Notes (Signed)
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 No sleep study His wife tells him he snores loudly; he wakes himself ER and worse on his back. Weight is stable. Daytime sleepiness-2 or 3 caffeine drinks per day. Bedtime 10 or 11 PM, sleep latency 20 minutes, waking 4 or 5 times before up at 5:45 AM No leg jerks or other unusual activity. No ENT surgery. Good general health.  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 Pt here to review sleep study. Pt has no other compliants.  Unattended home sleep study"[name]" [id number]- mild obstructive sleep apnea, AHI 13 per hour, weight 206 pounds Starting CPAP auto  ROS-see HPI Constitutional:   No-   weight loss, night sweats, fevers, chills,+ fatigue, lassitude. HEENT:   No-  headaches, difficulty swallowing, tooth/dental problems, sore throat,       No-  sneezing, itching, ear ache, nasal congestion, post nasal drip,  CV:  No-   chest pain, orthopnea, PND, swelling in lower extremities, anasarca,                                  dizziness, palpitations Resp: No-   shortness of breath with exertion or at rest.              No-   productive cough,  No non-productive cough,  No- coughing up of blood.              No-   change in color of mucus.  No- wheezing.   Skin: No-   rash or lesions. GI:  No-   heartburn, indigestion, abdominal pain, nausea, vomiting,  GU:  MS:  No-   joint pain or swelling.   Neuro-     nothing unusual Psych:  No- change in mood or affect. + depression or anxiety.  No memory loss.  OBJ- Physical Exam General- Alert, Oriented, Affect-appropriate, Distress- none acute, medium build Skin- rash-none, lesions- none, excoriation- none Lymphadenopathy- none Head- atraumatic            Eyes- Gross vision intact, PERRLA, conjunctivae and secretions clear            Ears- Hearing, canals-normal            Nose- Clear, no-Septal  dev, mucus, polyps, erosion, perforation             Throat- +Small mandible, Mallampati III , mucosa clear , drainage- none, tonsils- atrophic Neck- flexible , trachea midline, no stridor , thyroid nl, carotid no bruit Chest - symmetrical excursion , unlabored           Heart/CV- RRR , no murmur , no gallop  , no rub, nl s1 s2                           - JVD- none , edema- none, stasis changes- none, varices- none           Lung- clear to P&A, wheeze- none, cough- none , dullness-none, rub- none           Chest wall-  Abd-  Br/ Gen/ Rectal- Not done, not indicated Extrem- cyanosis- none, clubbing, none, atrophy- none, strength- nl Neuro- grossly intact to observation

## 2013-08-23 NOTE — Assessment & Plan Note (Signed)
Mild obstructive sleep apnea. We discussed treatment options Plan-he is going to start CPAP with autotitration

## 2013-08-23 NOTE — Patient Instructions (Signed)
Order- new DME new CPAP auto 5-20 cwp  X 7 days for pressure recommendation, mask of choice, supplies, humidifier        dxOSA

## 2013-10-11 ENCOUNTER — Encounter: Payer: Self-pay | Admitting: Internal Medicine

## 2013-10-11 ENCOUNTER — Ambulatory Visit (INDEPENDENT_AMBULATORY_CARE_PROVIDER_SITE_OTHER): Payer: 59 | Admitting: Internal Medicine

## 2013-10-11 VITALS — BP 110/72 | HR 57 | Ht 71.0 in | Wt 209.2 lb

## 2013-10-11 DIAGNOSIS — G4733 Obstructive sleep apnea (adult) (pediatric): Secondary | ICD-10-CM

## 2013-10-11 NOTE — Progress Notes (Signed)
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 No sleep study His wife tells him he snores loudly; he wakes himself ER and worse on his back. Weight is stable. Daytime sleepiness-2 or 3 caffeine drinks per day. Bedtime 10 or 11 PM, sleep latency 20 minutes, waking 4 or 5 times before up at 5:45 AM No leg jerks or other unusual activity. No ENT surgery. Good general health.  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 Pt here to review sleep study. Pt has no other compliants.  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 FOLLOWS FOR: uses CPAP auto every night; DME is [facility]. Can't tell "the big" difference with using CPAP as he thought he would-? pressure settings. Download   ROS-see HPI Constitutional:   No-   weight loss, night sweats, fevers, chills,+ fatigue, lassitude. HEENT:   No-  headaches, difficulty swallowing, tooth/dental problems, sore throat,       No-  sneezing, itching, ear ache, nasal congestion, post nasal drip,  CV:  No-   chest pain, orthopnea, PND, swelling in lower extremities, anasarca,                                  dizziness, palpitations Resp: No-   shortness of breath with exertion or at rest.              No-   productive cough,  No non-productive cough,  No- coughing up of blood.              No-   change in color of mucus.  No- wheezing.   Skin: No-   rash or lesions. GI:  No-   heartburn, indigestion, abdominal pain, nausea, vomiting,  GU:  MS:  No-   joint pain or swelling.   Neuro-     nothing unusual Psych:  No- change in mood or affect. + depression or anxiety.  No memory loss.  OBJ- Physical Exam General- Alert, Oriented, Affect-appropriate, Distress- none acute,  medium build Skin- rash-none, lesions- none, excoriation- none Lymphadenopathy- none Head- atraumatic            Eyes- Gross vision intact, PERRLA, conjunctivae and secretions clear            Ears- Hearing, canals-normal            Nose- Clear, no-Septal dev, mucus, polyps, erosion, perforation             Throat- +Small mandible, Mallampati III , mucosa clear , drainage- none, tonsils- atrophic Neck- flexible , trachea midline, no stridor , thyroid nl, carotid no bruit Chest - symmetrical excursion , unlabored           Heart/CV- RRR , no murmur , no gallop  , no rub, nl s1 s2                           - JVD- none , edema- none, stasis changes- none, varices- none           Lung- clear to P&A, wheeze- none, cough- none , dullness-none, rub- none           Chest wall-  Abd-  Br/ Gen/ Rectal- Not done, not indicated Extrem- cyanosis- none, clubbing, none, atrophy- none, strength- nl Neuro- grossly intact to observation

## 2013-10-11 NOTE — Patient Instructions (Signed)
Order- DME [facility] change CPAP to 7 fixed      Dx OSA  Please call as needed

## 2014-04-13 ENCOUNTER — Encounter: Payer: Self-pay | Admitting: Internal Medicine

## 2014-04-13 ENCOUNTER — Ambulatory Visit (INDEPENDENT_AMBULATORY_CARE_PROVIDER_SITE_OTHER): Payer: 59 | Admitting: Internal Medicine

## 2014-04-13 VITALS — BP 110/64 | HR 66 | Ht 71.0 in | Wt 212.4 lb

## 2014-04-13 DIAGNOSIS — G4710 Hypersomnia, unspecified: Secondary | ICD-10-CM

## 2014-04-13 DIAGNOSIS — G4730 Sleep apnea, unspecified: Secondary | ICD-10-CM

## 2014-04-13 NOTE — Patient Instructions (Addendum)
We can continue CPAP 7/ [facility]. I think your instruction booklet will show you how to adjust the RAMP feature, but if not, the folks at [facility] should be able to tell you over the phone.  Please call as needed

## 2014-04-13 NOTE — Progress Notes (Signed)
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 No sleep study His wife tells him he snores loudly; he wakes himself ER and worse on his back. Weight is stable. Daytime sleepiness-2 or 3 caffeine drinks per day. Bedtime 10 or 11 PM, sleep latency 20 minutes, waking 4 or 5 times before up at 5:45 AM No leg jerks or other unusual activity. No ENT surgery. Good general health.  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 Pt here to review sleep study. Pt has no other compliants.  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 FOLLOWS FOR: uses CPAP auto every night; DME is [facility]. Can't tell "the big" difference with using CPAP as he thought he would-? pressure settings. 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 FOLLOWS FOR: Wears CPAP 7/ [facility]  every night for about 5 hours (avg); pressure works well. Would like to get pressure to ramp up through night once mask is on. DME is [facility].  He is comfortable wearing CPAP now. His wife likes it because he no longer snores. He thinks he probably sleeps better. No problems identified.  ROS-see HPI Constitutional:   No-   weight loss, night sweats, fevers, chills,+ fatigue, lassitude. HEENT:   No-  headaches, difficulty swallowing, tooth/dental problems, sore throat,       No-  sneezing, itching, ear ache, nasal congestion, post nasal drip,  CV:  No-   chest pain, orthopnea, PND, swelling in lower extremities, anasarca,                                  dizziness, palpitations Resp: No-   shortness of breath with exertion or at rest.              No-   productive cough,  No non-productive cough,  No-  coughing up of blood.              No-   change in color of mucus.  No- wheezing.   Skin: No-   rash or lesions. GI:  No-   heartburn, indigestion, abdominal pain, nausea, vomiting,  GU:  MS:  No-   joint pain or swelling.   Neuro-     nothing unusual Psych:  No- change in mood or affect. + depression or anxiety.  No memory loss.  OBJ- Physical Exam General- Alert, Oriented, Affect-appropriate, Distress- none acute, medium build Skin- rash-none, lesions- none, excoriation- none Lymphadenopathy- none Head- atraumatic            Eyes- Gross vision intact, PERRLA, conjunctivae and secretions clear            Ears- Hearing, canals-normal            Nose- Clear, no-Septal dev, mucus, polyps, erosion, perforation             Throat- +Small mandible, Mallampati III , mucosa clear , drainage- none, tonsils- atrophic Neck- flexible , trachea midline, no stridor , thyroid nl, carotid no bruit Chest - symmetrical excursion , unlabored           Heart/CV- RRR , no murmur , no gallop  , no rub, nl s1  s2                           - JVD- none , edema- none, stasis changes- none, varices- none           Lung- clear to P&A, wheeze- none, cough- none , dullness-none, rub- none           Chest wall-  Abd-  Br/ Gen/ Rectal- Not done, not indicated Extrem- cyanosis- none, clubbing, none, atrophy- none, strength- nl Neuro- grossly intact to observation

## 2014-04-13 NOTE — Assessment & Plan Note (Signed)
Good compliance and control with CPAP set at 7/[facility]. He is sleeping better. CPAP is medically necessary.

## 2014-07-20 ENCOUNTER — Encounter: Payer: Self-pay | Admitting: Family Medicine

## 2014-07-20 ENCOUNTER — Telehealth: Payer: Self-pay | Admitting: Family Medicine

## 2014-07-20 DIAGNOSIS — Z0000 Encounter for general adult medical examination without abnormal findings: Secondary | ICD-10-CM

## 2014-07-20 NOTE — Telephone Encounter (Signed)
-----   Message from Marchia Bond sent at 07/20/2014  2:02 PM EDT ----- Regarding: Cpx labs tomorrow 5/27, need orders please :-) Please order  future cpx labs for pt's upcoming lab appt. Thanks Aniceto Boss

## 2014-07-21 ENCOUNTER — Other Ambulatory Visit (INDEPENDENT_AMBULATORY_CARE_PROVIDER_SITE_OTHER): Payer: 59

## 2014-07-21 DIAGNOSIS — Z0000 Encounter for general adult medical examination without abnormal findings: Secondary | ICD-10-CM | POA: Diagnosis not present

## 2014-07-21 LAB — CBC WITH DIFFERENTIAL/PLATELET
Basophils Absolute: 0 10*3/uL (ref 0.0–0.1)
Basophils Relative: 0.6 % (ref 0.0–3.0)
EOS ABS: 0.2 10*3/uL (ref 0.0–0.7)
Eosinophils Relative: 4 % (ref 0.0–5.0)
HEMATOCRIT: 44.1 % (ref 39.0–52.0)
HEMOGLOBIN: 14.8 g/dL (ref 13.0–17.0)
Lymphocytes Relative: 33 % (ref 12.0–46.0)
Lymphs Abs: 2 10*3/uL (ref 0.7–4.0)
MCHC: 33.6 g/dL (ref 30.0–36.0)
MCV: 91.1 fl (ref 78.0–100.0)
Monocytes Absolute: 0.6 10*3/uL (ref 0.1–1.0)
Monocytes Relative: 10.3 % (ref 3.0–12.0)
NEUTROS ABS: 3.2 10*3/uL (ref 1.4–7.7)
NEUTROS PCT: 52.1 % (ref 43.0–77.0)
PLATELETS: 207 10*3/uL (ref 150.0–400.0)
RBC: 4.84 Mil/uL (ref 4.22–5.81)
RDW: 13.3 % (ref 11.5–15.5)
WBC: 6.1 10*3/uL (ref 4.0–10.5)

## 2014-07-21 LAB — LIPID PANEL
CHOL/HDL RATIO: 3
CHOLESTEROL: 179 mg/dL (ref 0–200)
HDL: 58.6 mg/dL (ref >39.00)
LDL CALC: 109 mg/dL — AB (ref 0–99)
NonHDL: 120.4
Triglycerides: 56 mg/dL (ref 0.0–149.0)
VLDL: 11.2 mg/dL (ref 0.0–40.0)

## 2014-07-21 LAB — COMPREHENSIVE METABOLIC PANEL
ALT: 18 U/L (ref 0–53)
AST: 22 U/L (ref 0–37)
Albumin: 4.3 g/dL (ref 3.5–5.2)
Alkaline Phosphatase: 49 U/L (ref 39–117)
BILIRUBIN TOTAL: 0.6 mg/dL (ref 0.2–1.2)
BUN: 10 mg/dL (ref 6–23)
CHLORIDE: 102 meq/L (ref 96–112)
CO2: 32 meq/L (ref 19–32)
CREATININE: 1.01 mg/dL (ref 0.40–1.50)
Calcium: 9.4 mg/dL (ref 8.4–10.5)
GFR: 84.86 mL/min (ref >60.00)
Glucose, Bld: 97 mg/dL (ref 70–99)
POTASSIUM: 4.9 meq/L (ref 3.5–5.1)
Sodium: 136 mEq/L (ref 135–145)
TOTAL PROTEIN: 6.8 g/dL (ref 6.0–8.3)

## 2014-07-21 LAB — TSH: TSH: 1.69 u[IU]/mL (ref 0.35–4.50)

## 2014-07-26 ENCOUNTER — Ambulatory Visit (INDEPENDENT_AMBULATORY_CARE_PROVIDER_SITE_OTHER): Payer: 59 | Admitting: Family Medicine

## 2014-07-26 ENCOUNTER — Encounter: Payer: Self-pay | Admitting: Family Medicine

## 2014-07-26 VITALS — BP 126/72 | HR 67 | Temp 98.3°F | Ht 71.5 in | Wt 203.5 lb

## 2014-07-26 DIAGNOSIS — Z0000 Encounter for general adult medical examination without abnormal findings: Secondary | ICD-10-CM | POA: Diagnosis not present

## 2014-07-26 DIAGNOSIS — F32A Depression, unspecified: Secondary | ICD-10-CM

## 2014-07-26 DIAGNOSIS — F329 Major depressive disorder, single episode, unspecified: Secondary | ICD-10-CM

## 2014-07-26 MED ORDER — BUPROPION HCL ER (XL) 150 MG PO TB24: 150.0000 mg | ORAL_TABLET | Freq: Every day | ORAL | Status: DC

## 2014-07-26 MED ORDER — ESCITALOPRAM OXALATE 10 MG PO TABS: 10.0000 mg | ORAL_TABLET | Freq: Every day | ORAL | Status: DC

## 2014-07-26 MED ORDER — VALACYCLOVIR HCL 1 G PO TABS: ORAL_TABLET | ORAL | Status: DC

## 2014-07-26 NOTE — Patient Instructions (Signed)
Take care of yourself  Great job with weight loss and exercise  Labs look ok/stable

## 2014-07-26 NOTE — Progress Notes (Signed)
Pre visit review using our clinic review tool, if applicable. No additional management support is needed unless otherwise documented below in the visit note. 

## 2014-07-26 NOTE — Progress Notes (Signed)
Subjective:    Patient ID: Eric Blackburn, male    DOB: 10-04-1970, 44 y.o.   MRN: [id number]  HPI Here for health maintenance exam and to review chronic medical problems    Sister was dx with lung cancer - stage 36 - is young  Non smoker   feeling good overall   Wt is down 9 lb with bmi of 27 Has been walking a lot for health and weight loss - did a backpacking trip in April 1-2 miles per day  Wants to start some upper body exercise    HIV screen Declines/ not high risk Has been screened when giving blood   Flu shot 10/15  Td 4/15    Results for orders placed or performed in visit on 07/21/14  CBC with Differential/Platelet  Result Value Ref Range   WBC 6.1 4.0 - 10.5 K/uL   RBC 4.84 4.22 - 5.81 Mil/uL   Hemoglobin 14.8 13.0 - 17.0 g/dL   HCT 44.1 39.0 - 52.0 %   MCV 91.1 78.0 - 100.0 fl   MCHC 33.6 30.0 - 36.0 g/dL   RDW 13.3 11.5 - 15.5 %   Platelets 207.0 150.0 - 400.0 K/uL   Neutrophils Relative % 52.1 43.0 - 77.0 %   Lymphocytes Relative 33.0 12.0 - 46.0 %   Monocytes Relative 10.3 3.0 - 12.0 %   Eosinophils Relative 4.0 0.0 - 5.0 %   Basophils Relative 0.6 0.0 - 3.0 %   Neutro Abs 3.2 1.4 - 7.7 K/uL   Lymphs Abs 2.0 0.7 - 4.0 K/uL   Monocytes Absolute 0.6 0.1 - 1.0 K/uL   Eosinophils Absolute 0.2 0.0 - 0.7 K/uL   Basophils Absolute 0.0 0.0 - 0.1 K/uL  Comprehensive metabolic panel  Result Value Ref Range   Sodium 136 135 - 145 mEq/L   Potassium 4.9 3.5 - 5.1 mEq/L   Chloride 102 96 - 112 mEq/L   CO2 32 19 - 32 mEq/L   Glucose, Bld 97 70 - 99 mg/dL   BUN 10 6 - 23 mg/dL   Creatinine, Ser 1.01 0.40 - 1.50 mg/dL   Total Bilirubin 0.6 0.2 - 1.2 mg/dL   Alkaline Phosphatase 49 39 - 117 U/L   AST 22 0 - 37 U/L   ALT 18 0 - 53 U/L   Total Protein 6.8 6.0 - 8.3 g/dL   Albumin 4.3 3.5 - 5.2 g/dL   Calcium 9.4 8.4 - 10.5 mg/dL   GFR 84.86 >60.00 mL/min  Lipid panel  Result Value Ref Range   Cholesterol 179 0 - 200 mg/dL   Triglycerides 56.0 0.0 -  149.0 mg/dL   HDL 58.60 >39.00 mg/dL   VLDL 11.2 0.0 - 40.0 mg/dL   LDL Cholesterol 109 (H) 0 - 99 mg/dL   Total CHOL/HDL Ratio 3    NonHDL 120.40   TSH  Result Value Ref Range   TSH 1.69 0.35 - 4.50 uIU/mL    Good labs overall  High HDL   No prostate problems  Nocturia - once if he drinks a lot of fluids  No change in stream  Has hypospadias/narrow urethra-has seen urology (Dr Jeffie Pollock) - will f/u if any changes   No first degree relatives with prostate cancer  MGF had it at very old age   Patient Active Problem List   Diagnosis Date Noted  . Hypersomnia with sleep apnea 05/25/2013  . Routine general medical examination at a health care facility 05/16/2013  . DEPRESSION  03/31/2007  . HYPOSPADIAS 03/31/2007   Past Medical History  Diagnosis Date  . Depression    Past Surgical History  Procedure Laterality Date  . Hernia repair      x 2 as a child  . Anterior cruciate ligament repair  1992    Right   History  Substance Use Topics  . Smoking status: Never Smoker   . Smokeless tobacco: Never Used  . Alcohol Use: 0.0 oz/week    0 Standard drinks or equivalent per week     Comment: 1-4 drinks daily (beer)   Family History  Problem Relation Age of Onset  . Depression Mother   . Emphysema Maternal Grandfather   . Asthma Sister     developed as a child  . Prostate cancer Maternal Grandfather   . Cancer Paternal Grandmother    No Known Allergies Current Outpatient Prescriptions on File Prior to Visit  Medication Sig Dispense Refill  . buPROPion (WELLBUTRIN XL) 150 MG 24 hr tablet Take 1 tablet (150 mg total) by mouth daily. 90 tablet 3  . escitalopram (LEXAPRO) 10 MG tablet Take 1 tablet (10 mg total) by mouth daily. 90 tablet 3  . ibuprofen (ADVIL,MOTRIN) 200 MG tablet Take 400-800 mg by mouth every 6 (six) hours as needed (pain).     . Multiple Vitamin (MULTIVITAMIN) capsule Take 1 capsule by mouth every Monday, Wednesday, and Friday.     . valACYclovir (VALTREX)  1000 MG tablet 2 tablets by mouth two times a day for 1 day for cold sore as needed. 4 tablet 5   No current facility-administered medications on file prior to visit.       Review of Systems Review of Systems  Constitutional: Negative for fever, appetite change, fatigue and unexpected weight change.  Eyes: Negative for pain and visual disturbance.  Respiratory: Negative for cough and shortness of breath.   Cardiovascular: Negative for cp or palpitations    Gastrointestinal: Negative for nausea, diarrhea and constipation.  Genitourinary: Negative for urgency and frequency.  Skin: Negative for pallor or rash   Neurological: Negative for weakness, light-headedness, numbness and headaches.  Hematological: Negative for adenopathy. Does not bruise/bleed easily.  Psychiatric/Behavioral: Negative for dysphoric mood. The patient is not nervous/anxious.         Objective:   Physical Exam  Constitutional: He appears well-developed and well-nourished. No distress.  Mildly overwt (wt loss noted) and well appearing   HENT:  Head: Normocephalic and atraumatic.  Right Ear: External ear normal.  Left Ear: External ear normal.  Nose: Nose normal.  Mouth/Throat: Oropharynx is clear and moist.  Eyes: Conjunctivae and EOM are normal. Pupils are equal, round, and reactive to light. Right eye exhibits no discharge. Left eye exhibits no discharge. No scleral icterus.  Neck: Normal range of motion. Neck supple. No JVD present. Carotid bruit is not present. No thyromegaly present.  Cardiovascular: Normal rate, regular rhythm, normal heart sounds and intact distal pulses.  Exam reveals no gallop.   Pulmonary/Chest: Effort normal and breath sounds normal. No respiratory distress. He has no wheezes. He exhibits no tenderness.  Abdominal: Soft. Bowel sounds are normal. He exhibits no distension, no abdominal bruit and no mass. There is no tenderness.  Musculoskeletal: He exhibits no edema or tenderness.    Lymphadenopathy:    He has no cervical adenopathy.  Neurological: He is alert. He has normal reflexes. No cranial nerve deficit. He exhibits normal muscle tone. Coordination normal.  Skin: Skin is warm and dry. No  rash noted. No erythema. No pallor.  Many stable brown nevi on torso and back  Some lentigo  Psychiatric: He has a normal mood and affect.          Assessment & Plan:   Problem List Items Addressed This Visit    Routine general medical examination at a health care facility - Primary    Reviewed health habits including diet and exercise and skin cancer prevention Reviewed appropriate screening tests for age  Also reviewed health mt list, fam hx and immunization status , as well as social and family history   Commended on good exercise and wt loss  Labs reviewed and stable

## 2014-07-26 NOTE — Assessment & Plan Note (Signed)
Stable with lexapro and wellbutrin  Does well  Has not been able to come off of in the past  Reviewed stressors/ coping techniques/symptoms/ support sources/ tx options and side effects in detail today  Sister dx with lung cancer-disc reaction to that and expectations as well (counseling is always available)

## 2014-07-26 NOTE — Assessment & Plan Note (Signed)
Reviewed health habits including diet and exercise and skin cancer prevention Reviewed appropriate screening tests for age  Also reviewed health mt list, fam hx and immunization status , as well as social and family history   Commended on good exercise and wt loss  Labs reviewed and stable

## 2014-10-10 ENCOUNTER — Encounter: Payer: Self-pay | Admitting: Family Medicine

## 2015-03-09 DIAGNOSIS — G4733 Obstructive sleep apnea (adult) (pediatric): Secondary | ICD-10-CM | POA: Diagnosis not present

## 2015-04-16 ENCOUNTER — Ambulatory Visit (INDEPENDENT_AMBULATORY_CARE_PROVIDER_SITE_OTHER): Payer: 59 | Admitting: Internal Medicine

## 2015-04-16 ENCOUNTER — Encounter: Payer: Self-pay | Admitting: Internal Medicine

## 2015-04-16 VITALS — BP 110/68 | HR 58 | Ht 71.0 in | Wt 214.2 lb

## 2015-04-16 DIAGNOSIS — J310 Chronic rhinitis: Secondary | ICD-10-CM | POA: Insufficient documentation

## 2015-04-16 DIAGNOSIS — G4730 Sleep apnea, unspecified: Secondary | ICD-10-CM

## 2015-04-16 DIAGNOSIS — G4710 Hypersomnia, unspecified: Secondary | ICD-10-CM

## 2015-04-16 NOTE — Patient Instructions (Signed)
We can continue CPAP [facility]/ [facility]   Please call as needed

## 2015-04-16 NOTE — Assessment & Plan Note (Signed)
He is doing well now with CPAP [facility]. He does use Afrin just at bedtime, alternate nostrils which we discussed. Nasal mask. He is comfortable and feels this can be a long-term management for him. Quality of life is definitely improved.

## 2015-04-16 NOTE — Assessment & Plan Note (Signed)
We discussed cautious conservative use of Afrin to facilitate CPAP use at bedtime.

## 2015-04-16 NOTE — Progress Notes (Signed)
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 No sleep study His wife tells him he snores loudly; he wakes himself ER and worse on his back. Weight is stable. Daytime sleepiness-2 or 3 caffeine drinks per day. Bedtime 10 or 11 PM, sleep latency 20 minutes, waking 4 or 5 times before up at 5:45 AM No leg jerks or other unusual activity. No ENT surgery. Good general health.  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 Pt here to review sleep study. Pt has no other compliants.  Unattended home sleep study"[name]" [id number]- mild obstructive sleep apnea, AHI 13 per hour, weight 206 pounds Starting CPAP auto  10/11/13- 45 yoM never smoker, [employer], referred courtesy of Dr Tower;snoring loudly. FOLLOWS FOR: uses CPAP auto every night; DME is [facility]. Can't tell "the big" difference with using CPAP as he thought he would-? pressure settings. 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 FOLLOWS FOR: Wears CPAP 7/ Advanced  every night for about 5 hours (avg); pressure works well. Would like to get pressure to ramp up through night once mask is on. DME is [facility].  He is comfortable wearing CPAP now. His wife likes it because he no longer snores. He thinks he probably sleeps better. No problems identified.  04/16/2015-45 year old male never smoker, [employer] Pharmac[employer], followed for OSA CPAP 7/ Advanced FOLLOWS FOR: pt. states he wears CPAP 4-6hr. every night. pressure is good. no suppies needed. DME:[facility] Download confirms adequate usage and good control. He says his wife is comfortable with CPAP, no snoring. He feels he sleeps well. He does use Afrin at bedtime, alternate nostrils to control nasal stuffiness.  ROS-see HPI Constitutional:   No-   weight loss, night sweats, fevers, chills, fatigue, lassitude. HEENT:   No-   headaches, difficulty swallowing, tooth/dental problems, sore throat,       No-  sneezing, itching, ear ache, + nasal congestion, post nasal drip,  CV:  No-   chest pain, orthopnea, PND, swelling in lower extremities, anasarca,                                                    dizziness, palpitations Resp: No-   shortness of breath with exertion or at rest.              No-   productive cough,  No non-productive cough,  No- coughing up of blood.              No-   change in color of mucus.  No- wheezing.   Skin: No-   rash or lesions. GI:  No-   heartburn, indigestion, abdominal pain, nausea, vomiting,  GU:  MS:  No-   joint pain or swelling.   Neuro-     nothing unusual Psych:  No- change in mood or affect. + depression or anxiety.  No memory loss.  OBJ- Physical Exam General- Alert, Oriented, Affect-appropriate, Distress- none acute, medium build Skin- rash-none, lesions- none, excoriation- none Lymphadenopathy- none Head- atraumatic            Eyes- Gross vision intact, PERRLA, conjunctivae and secretions clear            Ears- Hearing, canals-normal  Nose- Clear, no-Septal dev, mucus, polyps, erosion, perforation             Throat- +Small mandible, Mallampati III , mucosa clear , drainage- none, tonsils- atrophic Neck- flexible , trachea midline, no stridor , thyroid nl, carotid no bruit Chest - symmetrical excursion , unlabored           Heart/CV- RRR , no murmur , no gallop  , no rub, nl s1 s2                           - JVD- none , edema- none, stasis changes- none, varices- none           Lung- clear to P&A, wheeze- none, cough- none , dullness-none, rub- none           Chest wall-  Abd-  Br/ Gen/ Rectal- Not done, not indicated Extrem- cyanosis- none, clubbing, none, atrophy- none, strength- nl Neuro- grossly intact to observation

## 2015-05-07 MED FILL — ESCITALOPRAM 10 MG TABLET: 10 | 90 days supply | Qty: 90 | Fill #3

## 2015-05-07 MED FILL — BUPROPION HCL XL 150 MG TAB: 150 | 90 days supply | Qty: 90 | Fill #3

## 2015-05-08 ENCOUNTER — Telehealth: Payer: 59 | Admitting: Nurse Practitioner

## 2015-05-08 DIAGNOSIS — R05 Cough: Secondary | ICD-10-CM

## 2015-05-08 DIAGNOSIS — R059 Cough, unspecified: Secondary | ICD-10-CM

## 2015-05-08 NOTE — Progress Notes (Signed)
We are sorry that you are not feeling well.  Here is how we plan to help!  Based on what you have shared with me it looks like you have upper respiratory tract inflammation that has resulted in a significant cough.  Inflammation and infection in the upper respiratory tract is commonly called bronchitis and has four common causes:  Allergies, Viral Infections, Acid Reflux and Bacterial Infections.  Allergies, viruses and acid reflux are treated by controlling symptoms or eliminating the cause. An example might be a cough caused by taking certain blood pressure medications. You stop the cough by changing the medication. Another example might be a cough caused by acid reflux. Controlling the reflux helps control the cough.  Based on your presentation I believe you most likely have A cough due to a virus.  This is called viral bronchitis and is best treated by rest, plenty of fluids and control of the cough.  You may use Ibuprofen or Tylenol as directed to help your symptoms.    In addition you may use A non-prescription cough medication called Robitussin DAC. Take 2 teaspoons every 8 hours or Delsym: take 2 teaspoons every 12 hours.    HOME CARE . Only take medications as instructed by your medical team. . Complete the entire course of an antibiotic. . Drink plenty of fluids and get plenty of rest. . Avoid close contacts especially the very young and the elderly . Cover your mouth if you cough or cough into your sleeve. . Always remember to wash your hands . A steam or ultrasonic humidifier can help congestion.    GET HELP RIGHT AWAY IF: . You develop worsening fever. . You become short of breath . You cough up blood. . Your symptoms persist after you have completed your treatment plan MAKE SURE YOU   Understand these instructions.  Will watch your condition.  Will get help right away if you are not doing well or get worse.  Your e-visit answers were reviewed by a board certified advanced  clinical practitioner to complete your personal care plan.  Depending on the condition, your plan could have included both over the counter or prescription medications. If there is a problem please reply  once you have received a response from your provider. Your safety is important to us.  If you have drug allergies check your prescription carefully.    You can use MyChart to ask questions about today's visit, request a non-urgent call back, or ask for a work or school excuse for 24 hours related to this e-Visit. If it has been greater than 24 hours you will need to follow up with your provider, or enter a new e-Visit to address those concerns. You will get an e-mail in the next two days asking about your experience.  I hope that your e-visit has been valuable and will speed your recovery. Thank you for using e-visits.  

## 2015-05-09 ENCOUNTER — Telehealth: Payer: 59 | Admitting: Nurse Practitioner

## 2015-05-09 DIAGNOSIS — J101 Influenza due to other identified influenza virus with other respiratory manifestations: Secondary | ICD-10-CM

## 2015-05-09 MED ORDER — OSELTAMIVIR PHOSPHATE 75 MG PO CAPS: 75.0000 mg | ORAL_CAPSULE | Freq: Two times a day (BID) | ORAL | Status: DC

## 2015-05-09 NOTE — Progress Notes (Signed)

## 2015-06-21 DIAGNOSIS — H5213 Myopia, bilateral: Secondary | ICD-10-CM | POA: Diagnosis not present

## 2015-06-21 DIAGNOSIS — H43313 Vitreous membranes and strands, bilateral: Secondary | ICD-10-CM | POA: Diagnosis not present

## 2015-07-06 MED FILL — VALACYCLOVIR HCL 500 MG TAB: 500 | 1 days supply | Qty: 8 | Fill #4

## 2015-08-06 ENCOUNTER — Encounter: Payer: Self-pay | Admitting: Family Medicine

## 2015-08-07 MED ORDER — ESCITALOPRAM OXALATE 10 MG PO TABS: 10.0000 mg | ORAL_TABLET | Freq: Every day | ORAL | Status: DC

## 2015-08-07 MED ORDER — BUPROPION HCL ER (XL) 150 MG PO TB24: 150.0000 mg | ORAL_TABLET | Freq: Every day | ORAL | Status: DC

## 2015-08-07 MED FILL — BUPROPION HCL XL 150 MG TAB: 150 | 90 days supply | Qty: 90 | Fill #0

## 2015-08-07 MED FILL — ESCITALOPRAM 10 MG TABLET: 10 | 90 days supply | Qty: 90 | Fill #0

## 2015-09-10 ENCOUNTER — Other Ambulatory Visit (INDEPENDENT_AMBULATORY_CARE_PROVIDER_SITE_OTHER): Payer: 59

## 2015-09-10 ENCOUNTER — Telehealth: Payer: Self-pay | Admitting: Family Medicine

## 2015-09-10 DIAGNOSIS — Z125 Encounter for screening for malignant neoplasm of prostate: Secondary | ICD-10-CM

## 2015-09-10 DIAGNOSIS — Z0000 Encounter for general adult medical examination without abnormal findings: Secondary | ICD-10-CM | POA: Diagnosis not present

## 2015-09-10 LAB — COMPREHENSIVE METABOLIC PANEL
ALBUMIN: 4.3 g/dL (ref 3.5–5.2)
ALK PHOS: 42 U/L (ref 39–117)
ALT: 17 U/L (ref 0–53)
AST: 21 U/L (ref 0–37)
BILIRUBIN TOTAL: 0.5 mg/dL (ref 0.2–1.2)
BUN: 14 mg/dL (ref 6–23)
CO2: 30 mEq/L (ref 19–32)
Calcium: 9.5 mg/dL (ref 8.4–10.5)
Chloride: 101 mEq/L (ref 96–112)
Creatinine, Ser: 1.01 mg/dL (ref 0.40–1.50)
GFR: 84.43 mL/min (ref >60.00)
GLUCOSE: 91 mg/dL (ref 70–99)
Potassium: 4.9 mEq/L (ref 3.5–5.1)
Sodium: 136 mEq/L (ref 135–145)
TOTAL PROTEIN: 6.5 g/dL (ref 6.0–8.3)

## 2015-09-10 LAB — PSA: PSA: 1.41 ng/mL (ref 0.10–4.00)

## 2015-09-10 LAB — LIPID PANEL
CHOLESTEROL: 164 mg/dL (ref 0–200)
HDL: 57.5 mg/dL (ref >39.00)
LDL Cholesterol: 95 mg/dL (ref 0–99)
NONHDL: 106.23
Total CHOL/HDL Ratio: 3
Triglycerides: 55 mg/dL (ref 0.0–149.0)
VLDL: 11 mg/dL (ref 0.0–40.0)

## 2015-09-10 LAB — TSH: TSH: 2.35 u[IU]/mL (ref 0.35–4.50)

## 2015-09-10 NOTE — Telephone Encounter (Signed)
-----   Message from Ellamae Sia sent at 09/10/2015  7:27 AM EDT ----- Regarding: Lab orders asap, thanks Patient is scheduled for CPX labs, please order future labs, Thanks , Karna Christmas

## 2015-09-11 LAB — CBC WITH DIFFERENTIAL/PLATELET
BASOS ABS: 0 10*3/uL (ref 0.0–0.1)
BASOS PCT: 0.5 % (ref 0.0–3.0)
EOS ABS: 0.2 10*3/uL (ref 0.0–0.7)
Eosinophils Relative: 3.5 % (ref 0.0–5.0)
HEMATOCRIT: 41.5 % (ref 39.0–52.0)
Hemoglobin: 14.1 g/dL (ref 13.0–17.0)
LYMPHS PCT: 36.9 % (ref 12.0–46.0)
Lymphs Abs: 2 10*3/uL (ref 0.7–4.0)
MCHC: 34.1 g/dL (ref 30.0–36.0)
MCV: 90.4 fl (ref 78.0–100.0)
MONO ABS: 0.5 10*3/uL (ref 0.1–1.0)
Monocytes Relative: 9 % (ref 3.0–12.0)
Neutro Abs: 2.8 10*3/uL (ref 1.4–7.7)
Neutrophils Relative %: 50.1 % (ref 43.0–77.0)
PLATELETS: 202 10*3/uL (ref 150.0–400.0)
RBC: 4.59 Mil/uL (ref 4.22–5.81)
RDW: 13.9 % (ref 11.5–15.5)
WBC: 5.5 10*3/uL (ref 4.0–10.5)

## 2015-09-19 ENCOUNTER — Encounter: Payer: 59 | Admitting: Family Medicine

## 2015-10-05 ENCOUNTER — Telehealth: Payer: Self-pay | Admitting: Internal Medicine

## 2015-10-05 DIAGNOSIS — G4710 Hypersomnia, unspecified: Secondary | ICD-10-CM

## 2015-10-05 DIAGNOSIS — G4730 Sleep apnea, unspecified: Principal | ICD-10-CM

## 2015-10-05 NOTE — Telephone Encounter (Signed)
Pt is requesting order for CPAP supplies sent to [facility]. Order placed. Pt aware. Nothing further needed.

## 2015-10-10 DIAGNOSIS — G4733 Obstructive sleep apnea (adult) (pediatric): Secondary | ICD-10-CM | POA: Diagnosis not present

## 2015-10-26 ENCOUNTER — Encounter: Payer: Self-pay | Admitting: Family Medicine

## 2015-10-26 ENCOUNTER — Ambulatory Visit (INDEPENDENT_AMBULATORY_CARE_PROVIDER_SITE_OTHER): Payer: 59 | Admitting: Family Medicine

## 2015-10-26 VITALS — BP 116/78 | HR 64 | Temp 98.0°F | Ht 71.5 in | Wt 206.5 lb

## 2015-10-26 DIAGNOSIS — Z0000 Encounter for general adult medical examination without abnormal findings: Secondary | ICD-10-CM | POA: Diagnosis not present

## 2015-10-26 DIAGNOSIS — F32A Depression, unspecified: Secondary | ICD-10-CM

## 2015-10-26 DIAGNOSIS — F329 Major depressive disorder, single episode, unspecified: Secondary | ICD-10-CM

## 2015-10-26 MED ORDER — ESCITALOPRAM OXALATE 10 MG PO TABS: 10.0000 mg | ORAL_TABLET | Freq: Every day | ORAL | 3 refills | Status: DC

## 2015-10-26 MED ORDER — VALACYCLOVIR HCL 1 G PO TABS: ORAL_TABLET | ORAL | 5 refills | Status: DC

## 2015-10-26 MED ORDER — BUPROPION HCL ER (XL) 150 MG PO TB24: 150.0000 mg | ORAL_TABLET | Freq: Every day | ORAL | 3 refills | Status: DC

## 2015-10-26 MED FILL — valACYclovir HCL 1 GM TABS: 1 | 1 days supply | Qty: 4 | Fill #0

## 2015-10-26 MED FILL — ESCITALOPRAM 10 MG TABLET: 10 | 90 days supply | Qty: 90 | Fill #0

## 2015-10-26 MED FILL — BUPROPION HCL XL 150 MG TAB: 150 | 90 days supply | Qty: 90 | Fill #0

## 2015-10-26 NOTE — Patient Instructions (Signed)
Take care of yourself  Eat a healthy diet and get back to walking  Labs look good  Get your flu shot at work as planned  Wear sun block for skin cancer prevention

## 2015-10-26 NOTE — Progress Notes (Signed)
Pre visit review using our clinic review tool, if applicable. No additional management support is needed unless otherwise documented below in the visit note. 

## 2015-10-26 NOTE — Progress Notes (Signed)
Subjective:    Patient ID: Eric Blackburn, male    DOB: [birth year], 45 y.o.   MRN: [id number]  HPI Here for health maintenance exam and to review chronic medical problems    Getting over a cold  Otherwise doing well  Not walking as much- plans to get back to it   Had a good summer    Flu shot - will get at work / works for [employer]   Has several skin tags and a bump (was a blackhead) Thinks he needs to go to the dermatologist anyway for a skin check  Has been once to [facility]   Tetanus shot 4/15 (Tdap)  Wt Readings from Last 3 Encounters:  10/26/15 206 lb 8 oz (93.7 kg)  04/16/15 214 lb 3.2 oz (97.2 kg)  07/26/14 203 lb 8 oz (92.3 kg)  wt is down some  Just joined a challenge at work for steps  Likes to walk 2-4 miles per day bmi is 28.4  No alcohol on weekdays -now- that will save calories    Prostate hx -no problems  No nocturia  No change in stream or problems emptying bladder  Has hypospadias-no changes in that -sees urology occ  MGF had prostate cancer  Lab Results  Component Value Date   PSA 1.41 09/10/2015    Hx of depression  On lexapro and wellbutrin Doing well / stable / wants to stay on them  Will be getting a promotion at work- there will be more stress and will watch this   Results for orders placed or performed in visit on 09/10/15  CBC with Differential/Platelet  Result Value Ref Range   WBC 5.5 4.0 - 10.5 K/uL   RBC 4.59 4.22 - 5.81 Mil/uL   Hemoglobin 14.1 13.0 - 17.0 g/dL   HCT 41.5 39.0 - 52.0 %   MCV 90.4 78.0 - 100.0 fl   MCHC 34.1 30.0 - 36.0 g/dL   RDW 13.9 11.5 - 15.5 %   Platelets 202.0 150.0 - 400.0 K/uL   Neutrophils Relative % 50.1 43.0 - 77.0 %   Lymphocytes Relative 36.9 12.0 - 46.0 %   Monocytes Relative 9.0 3.0 - 12.0 %   Eosinophils Relative 3.5 0.0 - 5.0 %   Basophils Relative 0.5 0.0 - 3.0 %   Neutro Abs 2.8 1.4 - 7.7 K/uL   Lymphs Abs 2.0 0.7 - 4.0 K/uL   Monocytes Absolute 0.5 0.1 - 1.0 K/uL   Eosinophils Absolute 0.2 0.0 - 0.7 K/uL   Basophils Absolute 0.0 0.0 - 0.1 K/uL  Comprehensive metabolic panel  Result Value Ref Range   Sodium 136 135 - 145 mEq/L   Potassium 4.9 3.5 - 5.1 mEq/L   Chloride 101 96 - 112 mEq/L   CO2 30 19 - 32 mEq/L   Glucose, Bld 91 70 - 99 mg/dL   BUN 14 6 - 23 mg/dL   Creatinine, Ser 1.01 0.40 - 1.50 mg/dL   Total Bilirubin 0.5 0.2 - 1.2 mg/dL   Alkaline Phosphatase 42 39 - 117 U/L   AST 21 0 - 37 U/L   ALT 17 0 - 53 U/L   Total Protein 6.5 6.0 - 8.3 g/dL   Albumin 4.3 3.5 - 5.2 g/dL   Calcium 9.5 8.4 - 10.5 mg/dL   GFR 84.43 >60.00 mL/min  Lipid panel  Result Value Ref Range   Cholesterol 164 0 - 200 mg/dL   Triglycerides 55.0 0.0 - 149.0 mg/dL   HDL  57.50 >39.00 mg/dL   VLDL 11.0 0.0 - 40.0 mg/dL   LDL Cholesterol 95 0 - 99 mg/dL   Total CHOL/HDL Ratio 3    NonHDL 106.23   PSA  Result Value Ref Range   PSA 1.41 0.10 - 4.00 ng/mL  TSH  Result Value Ref Range   TSH 2.35 0.35 - 4.50 uIU/mL    Glucose 91  Good cholesterol profile -pt states he eats well and not a strong family hx    Patient Active Problem List   Diagnosis Date Noted  . Prostate cancer screening 09/10/2015  . Rhinitis, nonallergic 04/16/2015  . Hypersomnia with sleep apnea 05/25/2013  . Routine general medical examination at a health care facility 05/16/2013  . Depression 03/31/2007  . HYPOSPADIAS 03/31/2007   Past Medical History:  Diagnosis Date  . Depression    Past Surgical History:  Procedure Laterality Date  . ANTERIOR CRUCIATE LIGAMENT REPAIR  1992   Right  . HERNIA REPAIR     x 2 as a child   Social History  Substance Use Topics  . Smoking status: Never Smoker  . Smokeless tobacco: Never Used  . Alcohol use 0.0 oz/week     Comment: 1-4 drinks daily (beer)   Family History  Problem Relation Age of Onset  . Depression Mother   . Emphysema Maternal Grandfather   . Asthma Sister     developed as a child  . Prostate cancer Maternal  Grandfather   . Cancer Paternal Grandmother   . Lung cancer Sister     small cell    No Known Allergies Current Outpatient Prescriptions on File Prior to Visit  Medication Sig Dispense Refill  . ibuprofen (ADVIL,MOTRIN) 200 MG tablet Take 400-800 mg by mouth every 6 (six) hours as needed (pain).     . Multiple Vitamin (MULTIVITAMIN) capsule Take 1 capsule by mouth every Monday, Wednesday, and Friday.      No current facility-administered medications on file prior to visit.     Review of Systems    Review of Systems  Constitutional: Negative for fever, appetite change, fatigue and unexpected weight change.  Eyes: Negative for pain and visual disturbance.  Respiratory: Negative for cough and shortness of breath.   Cardiovascular: Negative for cp or palpitations    Gastrointestinal: Negative for nausea, diarrhea and constipation.  Genitourinary: Negative for urgency and frequency.  Skin: Negative for pallor or rash  pos for occ cold sores  Neurological: Negative for weakness, light-headedness, numbness and headaches.  Hematological: Negative for adenopathy. Does not bruise/bleed easily.  Psychiatric/Behavioral: Negative for dysphoric mood. The patient is not nervous/anxious.      Objective:   Physical Exam  Constitutional: He appears well-developed and well-nourished. No distress.  overwt and well appearing   HENT:  Head: Normocephalic and atraumatic.  Right Ear: External ear normal.  Left Ear: External ear normal.  Nose: Nose normal.  Mouth/Throat: Oropharynx is clear and moist.  Eyes: Conjunctivae and EOM are normal. Pupils are equal, round, and reactive to light. Right eye exhibits no discharge. Left eye exhibits no discharge. No scleral icterus.  Neck: Normal range of motion. Neck supple. No JVD present. Carotid bruit is not present. No thyromegaly present.  Cardiovascular: Normal rate, regular rhythm, normal heart sounds and intact distal pulses.  Exam reveals no gallop.     Pulmonary/Chest: Effort normal and breath sounds normal. No respiratory distress. He has no wheezes. He exhibits no tenderness.  Abdominal: Soft. Bowel sounds are normal. He  exhibits no distension, no abdominal bruit and no mass. There is no tenderness.  Musculoskeletal: He exhibits no edema or tenderness.  Lymphadenopathy:    He has no cervical adenopathy.  Neurological: He is alert. He has normal reflexes. No cranial nerve deficit. He exhibits normal muscle tone. Coordination normal.  Skin: Skin is warm and dry. No rash noted. No erythema. No pallor.  Psychiatric: He has a normal mood and affect.          Assessment & Plan:   Problem List Items Addressed This Visit      Other   Routine general medical examination at a health care facility    Reviewed health habits including diet and exercise and skin cancer prevention Reviewed appropriate screening tests for age  Also reviewed health mt list, fam hx and immunization status , as well as social and family history    Labs reviewed  See HPI Take care of yourself  Eat a healthy diet and get back to walking  Labs look good  Get your flu shot at work as planned  Wear sun block for skin cancer prevention       Depression - Primary    Doing well with lexapro and wellbutrin  Has not been able to get off of it - feels good on it  Refilled today  Reviewed stressors/ coping techniques/symptoms/ support sources/ tx options and side effects in detail today Disc imp of exercise       Relevant Medications   escitalopram (LEXAPRO) 10 MG tablet   buPROPion (WELLBUTRIN XL) 150 MG 24 hr tablet    Other Visit Diagnoses   None.

## 2015-10-28 NOTE — Assessment & Plan Note (Signed)
Reviewed health habits including diet and exercise and skin cancer prevention Reviewed appropriate screening tests for age  Also reviewed health mt list, fam hx and immunization status , as well as social and family history    Labs reviewed  See HPI Take care of yourself  Eat a healthy diet and get back to walking  Labs look good  Get your flu shot at work as planned  Wear sun block for skin cancer prevention

## 2015-10-28 NOTE — Assessment & Plan Note (Signed)
Doing well with lexapro and wellbutrin  Has not been able to get off of it - feels good on it  Refilled today  Reviewed stressors/ coping techniques/symptoms/ support sources/ tx options and side effects in detail today Disc imp of exercise

## 2015-10-29 ENCOUNTER — Telehealth: Payer: Self-pay | Admitting: Family Medicine

## 2015-10-29 DIAGNOSIS — L723 Sebaceous cyst: Secondary | ICD-10-CM

## 2015-10-29 DIAGNOSIS — L918 Other hypertrophic disorders of the skin: Secondary | ICD-10-CM | POA: Insufficient documentation

## 2015-10-29 NOTE — Telephone Encounter (Signed)
Referring to dermatology Will forward to [facility]

## 2015-10-31 DIAGNOSIS — L720 Epidermal cyst: Secondary | ICD-10-CM | POA: Diagnosis not present

## 2015-10-31 NOTE — Telephone Encounter (Signed)
Appt made with Dr Allyson Sabal and patient aware.

## 2016-01-15 MED FILL — valACYclovir HCL 1 GM TABS: 1 | 1 days supply | Qty: 4 | Fill #1

## 2016-02-11 MED FILL — BUPROPION HCL XL 150 MG TAB: 150 | 90 days supply | Qty: 90 | Fill #1

## 2016-02-11 MED FILL — ESCITALOPRAM 10 MG TABLET: 10 | 90 days supply | Qty: 90 | Fill #1

## 2016-02-28 DIAGNOSIS — G4733 Obstructive sleep apnea (adult) (pediatric): Secondary | ICD-10-CM | POA: Diagnosis not present

## 2016-03-11 MED FILL — valACYclovir HCL 1 GM TABS: 1 | 1 days supply | Qty: 4 | Fill #2

## 2016-03-28 MED FILL — valACYclovir HCL 1 GM TABS: 1 | 1 days supply | Qty: 4 | Fill #3

## 2016-04-16 ENCOUNTER — Encounter: Payer: Self-pay | Admitting: Internal Medicine

## 2016-04-17 ENCOUNTER — Ambulatory Visit: Payer: 59 | Admitting: Internal Medicine

## 2016-04-18 ENCOUNTER — Encounter: Payer: Self-pay | Admitting: Internal Medicine

## 2016-04-18 ENCOUNTER — Ambulatory Visit (INDEPENDENT_AMBULATORY_CARE_PROVIDER_SITE_OTHER): Payer: 59 | Admitting: Internal Medicine

## 2016-04-18 VITALS — BP 118/68 | HR 76 | Ht 71.0 in | Wt 212.6 lb

## 2016-04-18 DIAGNOSIS — G4733 Obstructive sleep apnea (adult) (pediatric): Secondary | ICD-10-CM

## 2016-04-18 DIAGNOSIS — J310 Chronic rhinitis: Secondary | ICD-10-CM | POA: Diagnosis not present

## 2016-04-18 NOTE — Assessment & Plan Note (Signed)
He is using one spray of Afrin alternating nostrils one side per night. He did not find nasal strips helpful. There may be mild turbinate prominence but no significant mechanical obstruction anteriorly. We discussed option for ENT evaluation

## 2016-04-18 NOTE — Progress Notes (Signed)
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 states he wears CPAP 4-6hr. every night. pressure is good. no suppies needed. DME:[facility] Download confirms adequate usage and good control. He says his wife is comfortable with CPAP, no snoring. He feels he sleeps well. He does use Afrin at bedtime, alternate nostrils to control nasal stuffiness.  04/18/2016-46 year old male never smoker, [employer] [employer], followed for OSA CPAP 7/Advanced FOLLOWS FOR:DME: [facility]. Pt wears CPAP nightly and DL attached.  He continues to feel life is better with CPAP. We reviewed download documenting 83% 4 hour compliance with AHI 1.3/hour which meets goals. I encouraged him to report any discomforts so that we can make adjustments if needed. We reviewed alternative therapies.  ROS-see HPI  + = pos Constitutional:   No-   weight loss, night sweats, fevers, chills, fatigue, lassitude. HEENT:   No-  headaches, difficulty swallowing, tooth/dental problems, sore throat,       No-  sneezing, itching, ear ache, + nasal congestion, post nasal drip,  CV:  No-   chest pain, orthopnea, PND, swelling in lower extremities, anasarca,                                                    dizziness, palpitations Resp: No-   shortness of breath with exertion or at rest.              No-   productive cough,  No non-productive cough,  No- coughing up of blood.              No-   change in color of mucus.  No- wheezing.   Skin: No-   rash or lesions. GI:  No-   heartburn, indigestion, abdominal pain, nausea, vomiting,  GU:  MS:  No-   joint pain or swelling.   Neuro-     nothing  unusual Psych:  No- change in mood or affect. + depression or anxiety.  No memory loss.  OBJ- Physical Exam  +stable exam at this visit with no acute changes General- Alert, Oriented, Affect-appropriate, Distress- none acute, medium build Skin- rash-none, lesions- none, excoriation- none Lymphadenopathy- none Head- atraumatic            Eyes- Gross vision intact, PERRLA, conjunctivae and secretions clear            Ears- Hearing, canals-normal            Nose- Clear, no-Septal dev, mucus, polyps, erosion, perforation             Throat- +Small mandible, Mallampati III , mucosa clear , drainage- none, tonsils- atrophic Neck- flexible , trachea midline, no stridor , thyroid nl, carotid no bruit Chest - symmetrical excursion , unlabored           Heart/CV- RRR , no murmur , no gallop  , no rub, nl s1 s2                           - JVD- none , edema- none, stasis changes- none, varices- none  Lung- clear to P&A, wheeze- none, cough- none , dullness-none, rub- none           Chest wall-  Abd-  Br/ Gen/ Rectal- Not done, not indicated Extrem- cyanosis- none, clubbing, none, atrophy- none, strength- nl Neuro- grossly intact to observation

## 2016-04-18 NOTE — Assessment & Plan Note (Signed)
Download confirms successful compliance and control. He is satisfied to continue current settings. We discussed goals and comfort options. Plan-continue CPAP 7.

## 2016-04-18 NOTE — Patient Instructions (Signed)
We can continue CPAP 7, mask of choice, humidifier, supplies, AirView     Dx OSA  Please call if we can help

## 2016-04-22 MED FILL — valACYclovir HCL 1 GM TABS: 1 | 1 days supply | Qty: 4 | Fill #4

## 2016-05-14 MED FILL — BUPROPION HCL XL 150 MG TAB: 150 | 90 days supply | Qty: 90 | Fill #2

## 2016-05-14 MED FILL — ESCITALOPRAM 10 MG TABLET: 10 | 90 days supply | Qty: 90 | Fill #2

## 2016-08-05 MED FILL — ESCITALOPRAM 10 MG TABLET: 10 | 90 days supply | Qty: 90 | Fill #3

## 2016-08-05 MED FILL — BUPROPION XL 150 MG TAB: 150 | 90 days supply | Qty: 90 | Fill #3

## 2016-09-01 ENCOUNTER — Encounter: Payer: Self-pay | Admitting: Family Medicine

## 2016-09-01 DIAGNOSIS — G4733 Obstructive sleep apnea (adult) (pediatric): Secondary | ICD-10-CM | POA: Diagnosis not present

## 2016-09-03 MED ORDER — BUPROPION HCL ER (XL) 300 MG PO TB24: 300.0000 mg | ORAL_TABLET | Freq: Every day | ORAL | 11 refills | Status: DC

## 2016-09-03 NOTE — Telephone Encounter (Signed)
Would you please find out where he wants this wellbutrin sent and send it?  thanks

## 2016-09-03 NOTE — Telephone Encounter (Signed)
Dr. Glori Bickers sent Rx to [facility]. I left voicemail letting pt know that's where Dr. Glori Bickers sent Rx but if he needs [place] to change Rx to call us back and let us know

## 2016-10-07 MED FILL — buPROPion HCL ER (XL) 300 M: 300 | 30 days supply | Qty: 30 | Fill #0

## 2016-10-07 MED FILL — valACYclovir HCL 1 GM TABS: 1 | 1 days supply | Qty: 4 | Fill #5

## 2016-10-09 DIAGNOSIS — H5213 Myopia, bilateral: Secondary | ICD-10-CM | POA: Diagnosis not present

## 2016-10-09 DIAGNOSIS — H43313 Vitreous membranes and strands, bilateral: Secondary | ICD-10-CM | POA: Diagnosis not present

## 2016-11-04 ENCOUNTER — Other Ambulatory Visit: Payer: Self-pay | Admitting: Family Medicine

## 2016-11-04 MED FILL — BUPROPION HCL XL 300 MG TAB: 300 | 30 days supply | Qty: 30 | Fill #1

## 2016-11-04 NOTE — Telephone Encounter (Signed)
Please schedule a fall f/u and refill until then  

## 2016-11-04 NOTE — Telephone Encounter (Signed)
Pt hasn't been seen in over a year and no future appts., please advise  

## 2016-11-05 ENCOUNTER — Encounter: Payer: Self-pay | Admitting: Family Medicine

## 2016-11-05 MED ORDER — ESCITALOPRAM OXALATE 10 MG PO TABS: 10.0000 mg | ORAL_TABLET | Freq: Every day | ORAL | 0 refills | Status: DC

## 2016-11-07 MED FILL — ESCITALOPRAM 10 MG TABLET: 10 | 90 days supply | Qty: 90 | Fill #0

## 2016-12-01 ENCOUNTER — Encounter: Payer: Self-pay | Admitting: Family Medicine

## 2016-12-08 MED FILL — BUPROPION HCL XL 300 MG TAB: 300 | 30 days supply | Qty: 30 | Fill #2

## 2016-12-16 DIAGNOSIS — M545 Low back pain: Secondary | ICD-10-CM | POA: Diagnosis not present

## 2016-12-16 DIAGNOSIS — M4316 Spondylolisthesis, lumbar region: Secondary | ICD-10-CM | POA: Diagnosis not present

## 2016-12-16 MED FILL — predniSONE 5 MG (21) TBPK: 5 | 6 days supply | Qty: 21 | Fill #0

## 2016-12-17 ENCOUNTER — Encounter: Payer: Self-pay | Admitting: Family Medicine

## 2016-12-22 ENCOUNTER — Telehealth: Payer: Self-pay | Admitting: *Deleted

## 2016-12-22 DIAGNOSIS — Z0000 Encounter for general adult medical examination without abnormal findings: Secondary | ICD-10-CM

## 2016-12-22 DIAGNOSIS — Z125 Encounter for screening for malignant neoplasm of prostate: Secondary | ICD-10-CM

## 2016-12-22 NOTE — Telephone Encounter (Signed)
appt scheduled for labs tomorrow morning. (CPE scheduled for 12/24/16)  Dr. Glori Bickers please put lab orders in for pt

## 2016-12-22 NOTE — Telephone Encounter (Signed)
Copied from [facility] #[id number]. Topic: Appointment Scheduling - Scheduling Inquiry for Clinic >> Dec 22, 2016  3:10 PM Bea Graff, NT wrote: Reason for CRM: Patient is wanting to set up fasting labs for first thing in the morning. Please call pt.

## 2016-12-23 ENCOUNTER — Other Ambulatory Visit (INDEPENDENT_AMBULATORY_CARE_PROVIDER_SITE_OTHER): Payer: 59

## 2016-12-23 DIAGNOSIS — Z0000 Encounter for general adult medical examination without abnormal findings: Secondary | ICD-10-CM | POA: Diagnosis not present

## 2016-12-23 DIAGNOSIS — Z125 Encounter for screening for malignant neoplasm of prostate: Secondary | ICD-10-CM | POA: Diagnosis not present

## 2016-12-23 LAB — CBC WITH DIFFERENTIAL/PLATELET
BASOS ABS: 0 10*3/uL (ref 0.0–0.1)
Basophils Relative: 0.7 % (ref 0.0–3.0)
EOS PCT: 3.3 % (ref 0.0–5.0)
Eosinophils Absolute: 0.2 10*3/uL (ref 0.0–0.7)
HCT: 45.4 % (ref 39.0–52.0)
HEMOGLOBIN: 14.9 g/dL (ref 13.0–17.0)
LYMPHS ABS: 3 10*3/uL (ref 0.7–4.0)
LYMPHS PCT: 44.4 % (ref 12.0–46.0)
MCHC: 32.8 g/dL (ref 30.0–36.0)
MCV: 93.7 fl (ref 78.0–100.0)
MONOS PCT: 8.7 % (ref 3.0–12.0)
Monocytes Absolute: 0.6 10*3/uL (ref 0.1–1.0)
Neutro Abs: 2.9 10*3/uL (ref 1.4–7.7)
Neutrophils Relative %: 42.9 % — ABNORMAL LOW (ref 43.0–77.0)
Platelets: 249 10*3/uL (ref 150.0–400.0)
RBC: 4.84 Mil/uL (ref 4.22–5.81)
RDW: 13 % (ref 11.5–15.5)
WBC: 6.8 10*3/uL (ref 4.0–10.5)

## 2016-12-23 LAB — COMPREHENSIVE METABOLIC PANEL
ALBUMIN: 4.2 g/dL (ref 3.5–5.2)
ALK PHOS: 42 U/L (ref 39–117)
ALT: 15 U/L (ref 0–53)
AST: 14 U/L (ref 0–37)
BILIRUBIN TOTAL: 0.4 mg/dL (ref 0.2–1.2)
BUN: 13 mg/dL (ref 6–23)
CALCIUM: 9.7 mg/dL (ref 8.4–10.5)
CO2: 31 mEq/L (ref 19–32)
Chloride: 101 mEq/L (ref 96–112)
Creatinine, Ser: 1 mg/dL (ref 0.40–1.50)
GFR: 84.93 mL/min (ref >60.00)
GLUCOSE: 97 mg/dL (ref 70–99)
POTASSIUM: 6 meq/L — AB (ref 3.5–5.1)
Sodium: 140 mEq/L (ref 135–145)
TOTAL PROTEIN: 6.3 g/dL (ref 6.0–8.3)

## 2016-12-23 LAB — LIPID PANEL
CHOLESTEROL: 178 mg/dL (ref 0–200)
HDL: 59.5 mg/dL (ref >39.00)
LDL Cholesterol: 102 mg/dL — ABNORMAL HIGH (ref 0–99)
NONHDL: 118.56
Total CHOL/HDL Ratio: 3
Triglycerides: 81 mg/dL (ref 0.0–149.0)
VLDL: 16.2 mg/dL (ref 0.0–40.0)

## 2016-12-23 LAB — TSH: TSH: 3.01 u[IU]/mL (ref 0.35–4.50)

## 2016-12-23 LAB — PSA: PSA: 0.48 ng/mL (ref 0.10–4.00)

## 2016-12-24 ENCOUNTER — Ambulatory Visit (INDEPENDENT_AMBULATORY_CARE_PROVIDER_SITE_OTHER): Payer: 59 | Admitting: Family Medicine

## 2016-12-24 ENCOUNTER — Encounter: Payer: Self-pay | Admitting: Family Medicine

## 2016-12-24 VITALS — BP 118/80 | HR 72 | Temp 98.0°F | Ht 71.5 in | Wt 212.0 lb

## 2016-12-24 DIAGNOSIS — E875 Hyperkalemia: Secondary | ICD-10-CM | POA: Diagnosis not present

## 2016-12-24 DIAGNOSIS — M5442 Lumbago with sciatica, left side: Secondary | ICD-10-CM | POA: Diagnosis not present

## 2016-12-24 DIAGNOSIS — Z0000 Encounter for general adult medical examination without abnormal findings: Secondary | ICD-10-CM

## 2016-12-24 DIAGNOSIS — F329 Major depressive disorder, single episode, unspecified: Secondary | ICD-10-CM | POA: Diagnosis not present

## 2016-12-24 DIAGNOSIS — G8929 Other chronic pain: Secondary | ICD-10-CM | POA: Diagnosis not present

## 2016-12-24 DIAGNOSIS — Z125 Encounter for screening for malignant neoplasm of prostate: Secondary | ICD-10-CM

## 2016-12-24 LAB — POTASSIUM: Potassium: 4.6 mEq/L (ref 3.5–5.1)

## 2016-12-24 MED ORDER — VALACYCLOVIR HCL 1 G PO TABS: ORAL_TABLET | ORAL | 5 refills | Status: DC

## 2016-12-24 MED ORDER — METHOCARBAMOL 500 MG PO TABS: 500.0000 mg | ORAL_TABLET | Freq: Every evening | ORAL | 1 refills | Status: DC | PRN

## 2016-12-24 MED ORDER — TRAMADOL HCL 50 MG PO TABS: 50.0000 mg | ORAL_TABLET | Freq: Three times a day (TID) | ORAL | 0 refills | Status: DC | PRN

## 2016-12-24 NOTE — Patient Instructions (Signed)
Take care of yourself  Get back to exercise when you can  Get into PT asap   Re checking potassium today   Try the tramadol with caution for your back pain

## 2016-12-24 NOTE — Progress Notes (Signed)
Subjective:    Patient ID: Eric Blackburn, male    DOB: 01-15-1971, 46 y.o.   MRN: [id number]  HPI Here for health maintenance exam and to review chronic medical problems    Doing ok except for back pain  Saw Dr Tonita Cong  Has had problems on and off for years (had a pars defect)  Has intermittent problems since then   This past flare is worse/feels different and taking long time to get over  Did more xrays -pars defect as expected  Has exaggerated lordosis and spondylosis?   L5-S1 Put him on steroid dose pack - finished on Sunday  Overdid it this weekend - got worse  Lying down helps  Using robaxin and naproxen and old percocet  Has appt for PT next week ([facility])  Does have a bit of weakness in L foot   F/u Dr Tonita Cong in a month   Wt Readings from Last 3 Encounters:  12/24/16 212 lb (96.2 kg)  04/18/16 212 lb 9.6 oz (96.4 kg)  10/26/15 206 lb 8 oz (93.7 kg)  has not been able to exercise due to back/but has been walking a lot before  Eating fair-not the best /needs to work on portion size  29.16 kg/m  Flu shot - had it   Tetanus shot 4/15  Prostate screen  Lab Results  Component Value Date   PSA 0.48 12/23/2016   PSA 1.41 09/10/2015  h/o hypospadias - no longer has to see urology/no clinical changes  Nocturia - generally not  MGF with prostate cancer   Mood is better with inc wellbutrin  Has a new job with more responsibility - 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4 - 7.7 K/uL   Lymphs Abs 3.0 0.7 - 4.0 K/uL   Monocytes Absolute 0.6 0.1 - 1.0 K/uL   Eosinophils Absolute 0.2 0.0 - 0.7 K/uL   Basophils Absolute 0.0 0.0 - 0.1 K/uL  Comprehensive metabolic panel  Result Value Ref Range   Sodium 140 135 - 145 mEq/L   Potassium 6.0 (H) 3.5 - 5.1 mEq/L   Chloride 101 96 - 112 mEq/L   CO2 31 19 - 32 mEq/L   Glucose, Bld 97 70 - 99 mg/dL   BUN 13 6 - 23 mg/dL   Creatinine, Ser 1.00 0.40 - 1.50 mg/dL   Total Bilirubin 0.4 0.2 - 1.2 mg/dL   Alkaline Phosphatase 42 39 - 117 U/L   AST 14 0 - 37 U/L   ALT 15 0 - 53 U/L   Total Protein 6.3 6.0 - 8.3 g/dL   Albumin 4.2 3.5 - 5.2 g/dL   Calcium  9.7 8.4 - 10.5 mg/dL   GFR 84.93 >60.00 mL/min  Lipid panel  Result Value Ref Range   Cholesterol 178 0 - 200 mg/dL   Triglycerides 81.0 0.0 - 149.0 mg/dL   HDL 59.50 >39.00 mg/dL   VLDL 16.2 0.0 - 40.0 mg/dL   LDL Cholesterol 102 (H) 0 - 99 mg/dL   Total CHOL/HDL Ratio 3    NonHDL 118.56   TSH  Result Value Ref Range   TSH 3.01 0.35 - 4.50 uIU/mL  PSA  Result Value Ref Range   PSA 0.48 0.10 - 4.00 ng/mL    Not taking any K supplements  occ mvi -not often  No binge of high K foods  No cp or palpitations   Patient Active Problem List   Diagnosis Date Noted  . Hyperkalemia 12/24/2016  . Sebaceous cyst 10/29/2015  . Cutaneous skin tags 10/29/2015  . Prostate cancer screening 09/10/2015  . Rhinitis, nonallergic 04/16/2015  . Obstructive sleep apnea 05/25/2013  . Routine general medical examination at a health care facility 05/16/2013  . Back pain 02/29/2008  . Depression 03/31/2007  . HYPOSPADIAS 03/31/2007   Past Medical History:  Diagnosis Date  . Depression    Past Surgical History:  Procedure Laterality Date  . ANTERIOR CRUCIATE LIGAMENT REPAIR  1992   Right  . HERNIA REPAIR     x 2 as a child   Social History  Substance  Use Topics  . Smoking status: Never Smoker  . Smokeless tobacco: Never Used  . Alcohol use 0.0 oz/week     Comment: 1-4 drinks daily (beer)   Family History  Problem Relation Age of Onset  . Depression Mother   . Diabetes type II Mother   . Emphysema Maternal Grandfather   . Prostate cancer Maternal Grandfather   . Asthma Sister        developed as a child  . Cancer Paternal Grandmother   . Lung cancer Sister        small cell    No Known Allergies Current Outpatient Prescriptions on File Prior to Visit  Medication Sig Dispense Refill  . buPROPion (WELLBUTRIN XL) 300 MG 24 hr tablet Take 1 tablet (300 mg total) by mouth daily. 30 tablet 11  . escitalopram (LEXAPRO) 10 MG tablet Take 1 tablet (10 mg total) by mouth daily. 90 tablet 0  . Multiple Vitamin (MULTIVITAMIN) capsule Take 1 capsule by mouth every Monday, Wednesday, and Friday.      No current facility-administered medications on file prior to visit.     Review of Systems  Constitutional: Negative for activity change, appetite change, fatigue, fever and unexpected weight change.  HENT: Negative for congestion, rhinorrhea, sore throat and trouble swallowing.   Eyes: Negative for pain, redness, itching and visual disturbance.  Respiratory: Negative for cough, chest tightness, shortness of breath and wheezing.   Cardiovascular: Negative for chest pain and palpitations.  Gastrointestinal: Negative for abdominal pain, blood in stool, constipation, diarrhea and nausea.  Endocrine: Negative for cold intolerance, heat intolerance, polydipsia and polyuria.  Genitourinary: Negative for difficulty urinating, dysuria, frequency and urgency.  Musculoskeletal: Positive for back pain. Negative for arthralgias, joint swelling and myalgias.  Skin: Negative for pallor and rash.  Neurological: Negative for dizziness, tremors, weakness, numbness and headaches.  Hematological: Negative for adenopathy. Does not bruise/bleed easily.    Psychiatric/Behavioral: Negative for decreased concentration and dysphoric mood. The patient is not nervous/anxious.        Objective:   Physical Exam  Constitutional: He appears well-developed and well-nourished. No distress.  overwt and well app  HENT:  Head: Normocephalic and atraumatic.  Right Ear: External ear normal.  Left Ear: External ear normal.  Nose: Nose normal.  Mouth/Throat: Oropharynx is clear and moist.  Eyes: Pupils are equal, round, and reactive to light. Conjunctivae and EOM are normal. Right eye exhibits no discharge. Left eye exhibits no discharge. No scleral icterus.  Neck: Normal range of motion. Neck supple. No JVD present. Carotid bruit is not present. No thyromegaly present.  Cardiovascular: Normal rate, regular rhythm, normal heart sounds and intact distal pulses.  Exam reveals no gallop.   Pulmonary/Chest: Effort normal and breath sounds normal. No respiratory distress. He has no wheezes. He exhibits no tenderness.  Abdominal: Soft. Bowel sounds are normal. He exhibits no distension, no abdominal bruit and no mass. There is no tenderness.  Musculoskeletal: He exhibits no edema or tenderness.  Limited rom of LS with mild tenderness of L3-L5 area Gait is normal   Lymphadenopathy:    He has no cervical adenopathy.  Neurological: He is alert. He has normal reflexes. No cranial nerve deficit. He exhibits normal muscle tone. Coordination normal.  Skin: Skin is warm and dry. No rash noted. No erythema. No pallor.  Scattered lentigines and skin tags   Psychiatric: He has a normal mood and affect.          Assessment & Plan:   Problem List Items Addressed This Visit      Other   Back pain    Rev hx of recurrent back pain  To begin PT soon (urged the sooner the better)  Px for tramadol for pain as well as robaxin  Warning of sedation and habit-pt voiced understanding  Suspect he will improve with PT quickly        Relevant Medications   naproxen  sodium (ALEVE) 220 MG tablet   methocarbamol (ROBAXIN) 500 MG tablet   traMADol (ULTRAM) 50 MG tablet   Depression    Doing well since inc buproprion to 300 mg xl Continues lexapro generic  Reviewed stressors/ coping techniques/symptoms/ support sources/ tx options and side effects in detail today  Enc self care/exercise and outdoor time      Hyperkalemia    K of 6.0  No supplements Suspect hemolyzed/lab error Repeat today  No symptoms       Relevant Orders   Potassium (Completed)   Prostate cancer screening    Lab Results  Component Value Date   PSA 0.48 12/23/2016   PSA 1.41 09/10/2015   No symptoms  MGF with prostate cancer in older age       Routine general medical examination at a health care facility - Primary    Reviewed health habits including diet and exercise and skin cancer prevention Reviewed appropriate screening tests for age  Also reviewed health mt list, fam hx and immunization status , as well as social and family history   See HPI Labs rev  utd imms  Rev medications and chronic medial problems  Enc exercise  Re check K level

## 2016-12-25 NOTE — Assessment & Plan Note (Signed)
Rev hx of recurrent back pain  To begin PT soon (urged the sooner the better)  Px for tramadol for pain as well as robaxin  Warning of sedation and habit-pt voiced understanding  Suspect he will improve with PT quickly

## 2016-12-25 NOTE — Assessment & Plan Note (Signed)
Reviewed health habits including diet and exercise and skin cancer prevention Reviewed appropriate screening tests for age  Also reviewed health mt list, fam hx and immunization status , as well as social and family history   See HPI Labs rev  utd imms  Rev medications and chronic medial problems  Enc exercise  Re check K level

## 2016-12-25 NOTE — Assessment & Plan Note (Signed)
Lab Results  Component Value Date   PSA 0.48 12/23/2016   PSA 1.41 09/10/2015   No symptoms  MGF with prostate cancer in older age

## 2016-12-25 NOTE — Assessment & Plan Note (Signed)
K of 6.0  No supplements Suspect hemolyzed/lab error Repeat today  No symptoms

## 2016-12-25 NOTE — Assessment & Plan Note (Signed)
Doing well since inc buproprion to 300 mg xl Continues lexapro generic  Reviewed stressors/ coping techniques/symptoms/ support sources/ tx options and side effects in detail today  Enc self care/exercise and outdoor time

## 2016-12-30 DIAGNOSIS — M545 Low back pain: Secondary | ICD-10-CM | POA: Diagnosis not present

## 2017-01-06 MED FILL — BUPROPION HCL XL 300 MG TAB: 300 | 30 days supply | Qty: 30 | Fill #3

## 2017-01-07 DIAGNOSIS — M545 Low back pain: Secondary | ICD-10-CM | POA: Diagnosis not present

## 2017-01-13 DIAGNOSIS — M545 Low back pain: Secondary | ICD-10-CM | POA: Diagnosis not present

## 2017-01-20 DIAGNOSIS — M4316 Spondylolisthesis, lumbar region: Secondary | ICD-10-CM | POA: Diagnosis not present

## 2017-01-20 DIAGNOSIS — M545 Low back pain: Secondary | ICD-10-CM | POA: Diagnosis not present

## 2017-01-21 MED FILL — predniSONE 5 MG (21) TBPK: 5 | 6 days supply | Qty: 21 | Fill #0

## 2017-02-06 ENCOUNTER — Other Ambulatory Visit: Payer: Self-pay | Admitting: Family Medicine

## 2017-02-06 MED FILL — valACYclovir HCL 1 GM TABS: 1 | 1 days supply | Qty: 4 | Fill #0

## 2017-02-06 MED FILL — BUPROPION HCL XL 300 MG TAB: 300 | 30 days supply | Qty: 30 | Fill #4

## 2017-02-06 MED FILL — ESCITALOPRAM 10 MG TABLET: 10 | 90 days supply | Qty: 90 | Fill #0

## 2017-03-12 MED FILL — BUPROPION HCL XL 300 MG TAB: 300 | 30 days supply | Qty: 30 | Fill #5

## 2017-03-23 DIAGNOSIS — G4733 Obstructive sleep apnea (adult) (pediatric): Secondary | ICD-10-CM | POA: Diagnosis not present

## 2017-04-14 MED FILL — BUPROPION HCL XL 300 MG TAB: 300 | 30 days supply | Qty: 30 | Fill #6

## 2017-04-21 ENCOUNTER — Encounter: Payer: Self-pay | Admitting: Internal Medicine

## 2017-04-21 ENCOUNTER — Ambulatory Visit (INDEPENDENT_AMBULATORY_CARE_PROVIDER_SITE_OTHER): Payer: 59 | Admitting: Internal Medicine

## 2017-04-21 DIAGNOSIS — J310 Chronic rhinitis: Secondary | ICD-10-CM

## 2017-04-21 DIAGNOSIS — G4733 Obstructive sleep apnea (adult) (pediatric): Secondary | ICD-10-CM

## 2017-04-21 NOTE — Assessment & Plan Note (Signed)
This may be vasomotor rhinitis.  He is careful to use minimum necessary nasal decongestant spray.  We discussed alternatives again.  I do not recognize obvious surgical disease anteriorly and he is not interested in ENT evaluation at this point.

## 2017-04-21 NOTE — Assessment & Plan Note (Signed)
He is satisfied to continue CPAP.  He recognizes he continues to benefit.  We discussed compliance goals and comfort measures.  He may be eligible for replacement machine in another year.  At that time consider change to AutoPap.

## 2017-04-21 NOTE — Progress Notes (Signed)
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 Pt wears CPAP nightly and DL attached.  He continues to feel life is better with CPAP. We reviewed download documenting 83% 4 hour compliance with AHI 1.3/hour which meets goals. I encouraged him to report any discomforts so that we can make adjustments if needed. We reviewed alternative therapies.  04/21/17- 47 year old male never smoker, [employer] Pharmac[employer], followed for OSA, nasal congestion CPAP 7/Advanced ----OSA; DME: [facility]. Pt wears CPAP nightly and DL attached. No new supplies needed at this time.  Download 80% compliance, AHI 1.1/hour.  He and his wife both sleep better using CPAP. Perennial mild nasal stuffiness.  Breathe Right strips do not help.  He uses 1 puff of Afrin, alternating nostrils every other night and is aware of potential for rebound. Denies other active health problems of concern.  ROS-see HPI  + = pos Constitutional:   No-   weight loss, night sweats, fevers, chills, fatigue, lassitude. HEENT:   No-  headaches, difficulty swallowing, tooth/dental problems, sore throat,       No-  sneezing, itching, ear ache, + nasal congestion, post nasal drip,  CV:  No-   chest pain, orthopnea, PND, swelling in lower extremities, anasarca,                                                    dizziness, palpitations Resp: No-   shortness of breath with exertion or at rest.              No-   productive cough,  No non-productive cough,  No- coughing up of blood.              No-   change in color of mucus.  No- wheezing.   Skin: No-   rash or lesions. GI:  No-   heartburn,  indigestion, abdominal pain, nausea, vomiting,  GU:  MS:  No-   joint pain or swelling.   Neuro-     nothing unusual Psych:  No- change in mood or affect. + depression or anxiety.  No memory loss.  OBJ- Physical Exam  +stable exam at this visit with no acute changes General- Alert, Oriented, Affect-appropriate, Distress- none acute, medium build Skin- rash-none, lesions- none, excoriation- none Lymphadenopathy- none Head- atraumatic            Eyes- Gross vision intact, PERRLA, conjunctivae and secretions clear            Ears- Hearing, canals-normal            Nose- Clear, no-Septal dev, mucus, polyps, erosion, perforation             Throat- +Small mandible, Mallampati III , mucosa clear , drainage- none, tonsils- atrophic Neck- flexible , trachea midline, no stridor , thyroid nl, carotid no bruit Chest - symmetrical excursion , unlabored           Heart/CV- RRR , no murmur , no gallop  , no rub, nl s1 s2                           -  JVD- none , edema- none, stasis changes- none, varices- none           Lung- clear to P&A, wheeze- none, cough- none , dullness-none, rub- none           Chest wall-  Abd-  Br/ Gen/ Rectal- Not done, not indicated Extrem- cyanosis- none, clubbing, none, atrophy- none, strength- nl Neuro- grossly intact to observation

## 2017-04-21 NOTE — Patient Instructions (Signed)
We can continue CPAP 7, mask of choice, humidifier, supplies, [place]     Please call if we can help

## 2017-05-13 MED FILL — BUPROPION HCL XL 300 MG TAB: 300 | 30 days supply | Qty: 30 | Fill #7

## 2017-05-13 MED FILL — ESCITALOPRAM 10 MG TABLET: 10 | 90 days supply | Qty: 90 | Fill #1

## 2017-05-13 MED FILL — valACYclovir HCL 1 GM TABS: 1 | 1 days supply | Qty: 4 | Fill #1

## 2017-06-11 ENCOUNTER — Other Ambulatory Visit: Payer: Self-pay | Admitting: Family Medicine

## 2017-06-11 MED FILL — BuPROPion HCL ER (XL) 300 M: 300 | 30 days supply | Qty: 30 | Fill #0

## 2017-07-13 MED FILL — buPROPion HCL ER (XL) 300 M: 300 | 30 days supply | Qty: 30 | Fill #1

## 2017-07-14 MED FILL — valACYclovir HCL 1 GM TABS: 1 | 1 days supply | Qty: 4 | Fill #2

## 2017-07-28 DIAGNOSIS — I781 Nevus, non-neoplastic: Secondary | ICD-10-CM | POA: Diagnosis not present

## 2017-07-28 DIAGNOSIS — Z1283 Encounter for screening for malignant neoplasm of skin: Secondary | ICD-10-CM | POA: Diagnosis not present

## 2017-08-17 MED FILL — ESCITALOPRAM 10 MG TABLET: 10 | 90 days supply | Qty: 90 | Fill #2

## 2017-08-17 MED FILL — buPROPion HCL ER (XL) 300 M: 300 | 30 days supply | Qty: 30 | Fill #2

## 2017-09-17 MED FILL — buPROPion HCL ER (XL) 300 M: 300 | 30 days supply | Qty: 30 | Fill #3

## 2017-09-23 DIAGNOSIS — G4733 Obstructive sleep apnea (adult) (pediatric): Secondary | ICD-10-CM | POA: Diagnosis not present

## 2017-10-20 MED FILL — buPROPion HCL ER (XL) 300 M: 300 | 30 days supply | Qty: 30 | Fill #4

## 2017-11-18 ENCOUNTER — Other Ambulatory Visit: Payer: Self-pay | Admitting: Family Medicine

## 2017-11-18 MED FILL — valACYclovir HCL 1 GM TABS: 1 | 1 days supply | Qty: 4 | Fill #3

## 2017-11-18 MED FILL — buPROPion HCL ER (XL) 300 M: 300 | 30 days supply | Qty: 30 | Fill #5

## 2017-11-18 MED FILL — ESCITALOPRAM 10 MG TABLET: 10 | 90 days supply | Qty: 90 | Fill #0

## 2017-12-15 ENCOUNTER — Other Ambulatory Visit: Payer: Self-pay | Admitting: Family Medicine

## 2017-12-15 MED FILL — buPROPion HCL ER (XL) 300 M: 300 | 30 days supply | Qty: 30 | Fill #0

## 2017-12-15 MED FILL — valACYclovir HCL 1 GM TABS: 1 | 1 days supply | Qty: 4 | Fill #4

## 2017-12-17 DIAGNOSIS — H5213 Myopia, bilateral: Secondary | ICD-10-CM | POA: Diagnosis not present

## 2017-12-28 ENCOUNTER — Ambulatory Visit (INDEPENDENT_AMBULATORY_CARE_PROVIDER_SITE_OTHER): Payer: 59 | Admitting: Family Medicine

## 2017-12-28 ENCOUNTER — Encounter: Payer: Self-pay | Admitting: Family Medicine

## 2017-12-28 VITALS — BP 118/74 | HR 62 | Temp 98.3°F | Ht 71.5 in | Wt 213.2 lb

## 2017-12-28 DIAGNOSIS — F329 Major depressive disorder, single episode, unspecified: Secondary | ICD-10-CM

## 2017-12-28 DIAGNOSIS — Z125 Encounter for screening for malignant neoplasm of prostate: Secondary | ICD-10-CM

## 2017-12-28 DIAGNOSIS — Z0000 Encounter for general adult medical examination without abnormal findings: Secondary | ICD-10-CM | POA: Diagnosis not present

## 2017-12-28 LAB — CBC WITH DIFFERENTIAL/PLATELET
Basophils Absolute: 0 K/uL (ref 0.0–0.1)
Basophils Relative: 1 % (ref 0.0–3.0)
Eosinophils Absolute: 0.1 K/uL (ref 0.0–0.7)
Eosinophils Relative: 2.3 % (ref 0.0–5.0)
HCT: 44.1 % (ref 39.0–52.0)
Hemoglobin: 14.8 g/dL (ref 13.0–17.0)
Lymphocytes Relative: 30.4 % (ref 12.0–46.0)
Lymphs Abs: 1.5 K/uL (ref 0.7–4.0)
MCHC: 33.6 g/dL (ref 30.0–36.0)
MCV: 92.5 fl (ref 78.0–100.0)
Monocytes Absolute: 0.5 K/uL (ref 0.1–1.0)
Monocytes Relative: 9.6 % (ref 3.0–12.0)
Neutro Abs: 2.8 K/uL (ref 1.4–7.7)
Neutrophils Relative %: 56.7 % (ref 43.0–77.0)
Platelets: 228 K/uL (ref 150.0–400.0)
RBC: 4.77 Mil/uL (ref 4.22–5.81)
RDW: 13.4 % (ref 11.5–15.5)
WBC: 4.9 K/uL (ref 4.0–10.5)

## 2017-12-28 LAB — COMPREHENSIVE METABOLIC PANEL
ALK PHOS: 47 U/L (ref 39–117)
ALT: 15 U/L (ref 0–53)
AST: 17 U/L (ref 0–37)
Albumin: 4.3 g/dL (ref 3.5–5.2)
BUN: 10 mg/dL (ref 6–23)
CALCIUM: 9.5 mg/dL (ref 8.4–10.5)
CHLORIDE: 101 meq/L (ref 96–112)
CO2: 31 meq/L (ref 19–32)
Creatinine, Ser: 0.96 mg/dL (ref 0.40–1.50)
GFR: 88.65 mL/min (ref >60.00)
Glucose, Bld: 106 mg/dL — ABNORMAL HIGH (ref 70–99)
Potassium: 4.5 mEq/L (ref 3.5–5.1)
Sodium: 138 mEq/L (ref 135–145)
Total Bilirubin: 0.5 mg/dL (ref 0.2–1.2)
Total Protein: 6.6 g/dL (ref 6.0–8.3)

## 2017-12-28 LAB — LIPID PANEL
CHOL/HDL RATIO: 3
Cholesterol: 176 mg/dL (ref 0–200)
HDL: 59.3 mg/dL (ref >39.00)
LDL CALC: 101 mg/dL — AB (ref 0–99)
NonHDL: 116.78
TRIGLYCERIDES: 80 mg/dL (ref 0.0–149.0)
VLDL: 16 mg/dL (ref 0.0–40.0)

## 2017-12-28 LAB — TSH: TSH: 1.7 u[IU]/mL (ref 0.35–4.50)

## 2017-12-28 LAB — PSA: PSA: 1.11 ng/mL (ref 0.10–4.00)

## 2017-12-28 MED ORDER — ESCITALOPRAM OXALATE 10 MG PO TABS: 10.0000 mg | ORAL_TABLET | Freq: Every day | ORAL | 3 refills | Status: DC

## 2017-12-28 MED ORDER — BUPROPION HCL ER (XL) 300 MG PO TB24: 300.0000 mg | ORAL_TABLET | Freq: Every day | ORAL | 3 refills | Status: DC

## 2017-12-28 NOTE — Assessment & Plan Note (Signed)
Reviewed health habits including diet and exercise and skin cancer prevention Reviewed appropriate screening tests for age  Also reviewed health mt list, fam hx and immunization status , as well as social and family history   See HPI Labs ordered

## 2017-12-28 NOTE — Assessment & Plan Note (Signed)
PSA today  No clinical changes  Baseline flow issues from hypospadias - but no change  Nocturia depends on pm liquid intake  Lab today  MGF had prostate cancer

## 2017-12-28 NOTE — Progress Notes (Signed)
Subjective:    Patient ID: Eric Blackburn, male    DOB: 02/22/1971, 47 y.o.   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 [employer]  Very high achievers   Feeling good overall  Good self care  fitbit- trying to do 10,000 steps per day   Wt Readings from Last 3 Encounters:  12/28/17 213 lb 4 oz (96.7 kg)  04/21/17 218 lb (98.9 kg)  12/24/16 212 lb (96.2 kg)  wt is down  Eating better- less in general  29.33 kg/m   Flu vaccine 9/19  Tetanus vaccine 4/15  Prostate health No complaints  Nocturia - only if he drinks a lot of water in the evenings  No problem with flow/stream (baseline)  Hypospadias - unchanged Lab Results  Component Value Date   PSA 0.48 12/23/2016   PSA 1.41 09/10/2015   labs due today  MGF with prostate cancer   No new fam hx   Sees derm yearly because aunt had melanoma  Due for wellness labs  Avoids high cholesterol foods   Patient Active Problem List   Diagnosis Date Noted  . Prostate cancer screening 09/10/2015  . Rhinitis, nonallergic 04/16/2015  . Obstructive sleep apnea 05/25/2013  . Routine general medical examination at a health care facility 05/16/2013  . Back pain 02/29/2008  . Depression 03/31/2007  . HYPOSPADIAS 03/31/2007   Past Medical History:  Diagnosis Date  . Depression    Past Surgical History:  Procedure Laterality Date  . ANTERIOR CRUCIATE LIGAMENT REPAIR  1992   Right  . HERNIA REPAIR     x 2 as a child   Social History   Tobacco Use  . Smoking status: Never Smoker  . Smokeless tobacco: Never Used  Substance Use Topics  . Alcohol use: Yes    Alcohol/week: 0.0 standard drinks    Comment: 1-4 drinks daily (beer)  . Drug use: No   Family History  Problem Relation Age of Onset  . Depression Mother   . Diabetes type II Mother   . Emphysema Maternal Grandfather   . Prostate cancer Maternal Grandfather   .  Asthma Sister        developed as a child  . Cancer Paternal Grandmother   . Lung cancer Sister        small cell    No Known Allergies Current Outpatient Medications on File Prior to Visit  Medication Sig Dispense Refill  . methocarbamol (ROBAXIN) 500 MG tablet Take 1 tablet (500 mg total) by mouth at bedtime as needed for muscle spasms. 30 tablet 1  . Multiple Vitamin (MULTIVITAMIN) capsule Take 1 capsule by mouth every Monday, Wednesday, and Friday.     . naproxen sodium (ALEVE) 220 MG tablet Take 220-440 mg by mouth 2 (two) times daily as needed (mild pain).     Marland Kitchen oxymetazoline (AFRIN) 0.05 % nasal spray Place 1 spray into both nostrils at bedtime. Alternate nostril    . traMADol (ULTRAM) 50 MG tablet Take 1 tablet (50 mg total) by mouth every 8 (eight) hours as needed. 45 tablet 0  . valACYclovir (VALTREX) 1000 MG tablet TAKE 2 TABLETS BY MOUTH TWO TIMES A DAY FOR 1 DAY FOR COLD SORE AS NEEDED. 4 tablet 5   No current facility-administered medications on file prior to visit.     Review of Systems  Constitutional: Negative for activity change, appetite  change, fatigue, fever and unexpected weight change.  HENT: Negative for congestion, rhinorrhea, sore throat and trouble swallowing.   Eyes: Negative for pain, redness, itching and visual disturbance.  Respiratory: Negative for cough, chest tightness, shortness of breath and wheezing.   Cardiovascular: Negative for chest pain and palpitations.  Gastrointestinal: Negative for abdominal pain, blood in stool, constipation, diarrhea and nausea.  Endocrine: Negative for cold intolerance, heat intolerance, polydipsia and polyuria.  Genitourinary: Negative for difficulty urinating, dysuria, frequency and urgency.       Hypospadias affects flow /stream baseline  Musculoskeletal: Negative for arthralgias, joint swelling and myalgias.  Skin: Negative for pallor and rash.  Neurological: Negative for dizziness, tremors, weakness, numbness and  headaches.  Hematological: Negative for adenopathy. Does not bruise/bleed easily.  Psychiatric/Behavioral: Negative for decreased concentration and dysphoric mood. The patient is not nervous/anxious.        Mood is stable        Objective:   Physical Exam  Constitutional: He appears well-developed and well-nourished. No distress.  Well appearing   HENT:  Head: Normocephalic and atraumatic.  Right Ear: External ear normal.  Left Ear: External ear normal.  Nose: Nose normal.  Mouth/Throat: Oropharynx is clear and moist.  Eyes: Pupils are equal, round, and reactive to light. Conjunctivae and EOM are normal. Right eye exhibits no discharge. Left eye exhibits no discharge. No scleral icterus.  Neck: Normal range of motion. Neck supple. No JVD present. Carotid bruit is not present. No thyromegaly present.  Cardiovascular: Normal rate, regular rhythm, normal heart sounds and intact distal pulses. Exam reveals no gallop.  Pulmonary/Chest: Effort normal and breath sounds normal. No respiratory distress. He has no wheezes. He has no rales. He exhibits no tenderness.  Abdominal: Soft. Bowel sounds are normal. He exhibits no distension, no abdominal bruit and no mass. There is no tenderness.  Musculoskeletal: He exhibits no edema or tenderness.  No kyphosis   Lymphadenopathy:    He has no cervical adenopathy.  Neurological: He is alert. He has normal reflexes. He displays normal reflexes. No cranial nerve deficit. He exhibits normal muscle tone. Coordination normal.  Skin: Skin is warm and dry. No rash noted. No erythema. No pallor.  Solar lentigines diffusely Also stable brown nevi on trunk Some sun changes on face  Psychiatric: He has a normal mood and affect.  Pleasant and talkative           Assessment & Plan:   Problem List Items Addressed This Visit      Other   Depression    Stable with lexapro and wellbutrin Refills done Reviewed stressors/ coping techniques/symptoms/  support sources/ tx options and side effects in detail today -doing well adj to empty nest  Stressed imp of self care      Relevant Medications   escitalopram (LEXAPRO) 10 MG tablet   buPROPion (WELLBUTRIN XL) 300 MG 24 hr tablet   Prostate cancer screening    PSA today  No clinical changes  Baseline flow issues from hypospadias - but no change  Nocturia depends on pm liquid intake  Lab today  MGF had prostate cancer      Relevant Orders   PSA (Completed)   Routine general medical examination at a health care facility - Primary    Reviewed health habits including diet and exercise and skin cancer prevention Reviewed appropriate screening tests for age  Also reviewed health mt list, fam hx and immunization status , as well as social and family history  See HPI Labs ordered       Relevant Orders   CBC with Differential/Platelet (Completed)   Comprehensive metabolic panel (Completed)   Lipid panel (Completed)   TSH (Completed)

## 2017-12-28 NOTE — Assessment & Plan Note (Signed)
Stable with lexapro and wellbutrin Refills done Reviewed stressors/ coping techniques/symptoms/ support sources/ tx options and side effects in detail today -doing well adj to empty nest  Stressed imp of self care

## 2017-12-28 NOTE — Patient Instructions (Signed)
Labs today  Take care of yourself   Stay active Eat a healthy diet / smaller portions   Labs today

## 2018-01-18 MED FILL — buPROPion HCL ER (XL) 300 M: 300 | 90 days supply | Qty: 90 | Fill #0

## 2018-02-02 MED FILL — valACYclovir HCL 1 GM TABS: 1 | 1 days supply | Qty: 4 | Fill #5

## 2018-02-18 ENCOUNTER — Ambulatory Visit (HOSPITAL_COMMUNITY)
Admission: EM | Admit: 2018-02-18 | Discharge: 2018-02-18 | Disposition: A | Discharge status: Home / Self Care | Payer: 59 | Attending: Family Medicine | Admitting: Family Medicine

## 2018-02-18 ENCOUNTER — Encounter (HOSPITAL_COMMUNITY): Payer: Self-pay

## 2018-02-18 DIAGNOSIS — M5432 Sciatica, left side: Secondary | ICD-10-CM | POA: Diagnosis not present

## 2018-02-18 MED ORDER — PREDNISONE 20 MG PO TABS: ORAL_TABLET | ORAL | 0 refills | Status: DC

## 2018-02-18 MED ORDER — METHOCARBAMOL 500 MG PO TABS: 500.0000 mg | ORAL_TABLET | Freq: Every evening | ORAL | 1 refills | Status: DC | PRN

## 2018-02-18 MED FILL — METHOCARBAMOL 500 MG TABLET: 500 | 30 days supply | Qty: 30 | Fill #0

## 2018-02-18 MED FILL — predniSONE 20 MG TABS: 20 | 9 days supply | Qty: 18 | Fill #0

## 2018-02-18 MED FILL — ESCITALOPRAM 10 MG TABLET: 10 | 90 days supply | Qty: 90 | Fill #0

## 2018-02-18 NOTE — ED Triage Notes (Signed)
Pt Present lower back pain that started about a week ago.

## 2018-02-18 NOTE — ED Provider Notes (Signed)
[place]    CSN: [id number] Arrival date & time: 02/18/18  5329     History   Chief Complaint Chief Complaint  Patient presents with  . Back Pain    Lower     HPI Eric Blackburn is a 47 y.o. male.   This is a 47 year old patient who comes to [facility] urgent care complaining of low back pain for the last week.  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  He recently went out to [employer] number of roller coasters and then flew back and noticed that he was having progressive left sciatic pain.  He has had this before and has seen orthopedic for.  He knows he supposed to be doing exercises but he has not.     Past Medical History:  Diagnosis Date  . Depression     Patient Active Problem List   Diagnosis Date Noted  . Prostate cancer screening 09/10/2015  . Rhinitis, nonallergic 04/16/2015  . Obstructive sleep apnea 05/25/2013  . Routine general medical examination at a health care facility 05/16/2013  . Back pain 02/29/2008  . Depression 03/31/2007  . HYPOSPADIAS 03/31/2007    Past Surgical History:  Procedure Laterality Date  . ANTERIOR CRUCIATE LIGAMENT REPAIR  1992   Right  . HERNIA REPAIR     x 2 as a child       Home Medications    Prior to Admission medications   Medication Sig Start Date End Date Taking? Authorizing Provider  buPROPion (WELLBUTRIN XL) 300 MG 24 hr tablet Take 1 tablet (300 mg total) by mouth daily. 12/28/17   Tower, Wynelle Fanny, MD  escitalopram (LEXAPRO) 10 MG tablet Take 1 tablet (10 mg total) by mouth daily. 12/28/17   Tower, Wynelle Fanny, MD  methocarbamol (ROBAXIN) 500 MG tablet Take 1 tablet (500 mg total) by mouth at bedtime as needed for muscle spasms. 02/18/18   Robyn Haber, MD  Multiple Vitamin (MULTIVITAMIN) capsule Take 1 capsule by mouth every Monday, Wednesday, and Friday.     [provider]  naproxen sodium (ALEVE) 220 MG tablet Take 220-440 mg by mouth 2 (two) times daily as needed (mild pain).      [provider]  oxymetazoline (AFRIN) 0.05 % nasal spray Place 1 spray into both nostrils at bedtime. Alternate nostril    [provider]  predniSONE (DELTASONE) 20 MG tablet Take 3 PO QAM x3days, 2 PO QAM x3days, 1 PO QAM x3days 02/18/18   Robyn Haber, MD  valACYclovir (VALTREX) 1000 MG tablet TAKE 2 TABLETS BY MOUTH TWO TIMES A DAY FOR 1 DAY FOR COLD SORE AS NEEDED. 02/06/17   Tower, Wynelle Fanny, MD    Family History Family History  Problem Relation Age of Onset  . Depression Mother   . Diabetes type II Mother   . Emphysema Maternal Grandfather   . Prostate cancer Maternal Grandfather   . Asthma Sister        developed as a child  . Cancer Paternal Grandmother   . Lung cancer Sister        small cell     Social History Social History   Tobacco Use  . Smoking status: Never Smoker  . Smokeless tobacco: Never Used  Substance Use Topics  . Alcohol use: Yes    Alcohol/week: 0.0 standard drinks    Comment: 1-4 drinks daily (beer)  . Drug use: No     Allergies   Patient has no known allergies.  Review of Systems Review of Systems   Physical Exam Triage Vital Signs ED Triage Vitals  Enc Vitals Group     BP 02/18/18 0839 122/83     Pulse Rate 02/18/18 0839 62     Resp 02/18/18 0839 18     Temp 02/18/18 0839 98.1 F (36.7 C)     Temp Source 02/18/18 0839 Oral     SpO2 02/18/18 0839 98 %     Weight --      Height --      Head Circumference --      Peak Flow --      Pain Score 02/18/18 0841 8     Pain Loc --      Pain Edu? --      Excl. in [place]? --    No data found.  Updated Vital Signs BP 122/83 (BP Location: Right Arm)   Pulse 62   Temp 98.1 F (36.7 C) (Oral)   Resp 18   SpO2 98%   Physical Exam Vitals signs and nursing note reviewed.  Constitutional:      Appearance: Normal appearance.  HENT:     Head: Normocephalic.  Eyes:     Conjunctiva/sclera: Conjunctivae normal.  Pulmonary:     Effort: Pulmonary effort is normal.    Musculoskeletal: Normal range of motion.     Comments: Straight leg raising is positive on the left when seated  Skin:    General: Skin is warm and dry.  Neurological:     General: No focal deficit present.     Mental Status: He is alert.     Cranial Nerves: No cranial nerve deficit.     Motor: No weakness.     Coordination: Coordination normal.     Gait: Gait normal.  Psychiatric:        Mood and Affect: Mood normal.      UC Treatments / Results  Labs (all labs ordered are listed, but only abnormal results are displayed) Labs Reviewed - No data to display  EKG None  Radiology No results found.  Procedures Procedures (including critical care time)  Medications Ordered in UC Medications - No data to display  Initial Impression / Assessment and Plan / UC Course  I have reviewed the triage vital signs and the nursing notes.  Pertinent labs & imaging results that were available during my care of the patient were reviewed by me and considered in my medical decision making (see chart for details).    Final Clinical Impressions(s) / UC Diagnoses   Final diagnoses:  Sciatica of left side   Discharge Instructions   None    ED Prescriptions    Medication Sig Dispense Auth. Provider   methocarbamol (ROBAXIN) 500 MG tablet Take 1 tablet (500 mg total) by mouth at bedtime as needed for muscle spasms. 30 tablet Robyn Haber, MD   predniSONE (DELTASONE) 20 MG tablet Take 3 PO QAM x3days, 2 PO QAM x3days, 1 PO QAM x3days 18 tablet Robyn Haber, MD     Controlled Substance Prescriptions [place] Controlled Substance Registry consulted? Not Applicable   Robyn Haber, MD 02/18/18 [phone]

## 2018-02-22 DIAGNOSIS — G4733 Obstructive sleep apnea (adult) (pediatric): Secondary | ICD-10-CM | POA: Diagnosis not present

## 2018-04-02 ENCOUNTER — Telehealth: Payer: 59 | Admitting: Family

## 2018-04-02 DIAGNOSIS — M545 Low back pain, unspecified: Secondary | ICD-10-CM

## 2018-04-02 MED ORDER — PREDNISONE 10 MG (21) PO TBPK: ORAL_TABLET | ORAL | 0 refills | Status: DC

## 2018-04-02 MED FILL — predniSONE 10 MG (21) TBPK: 10 | 6 days supply | Qty: 21 | Fill #0

## 2018-04-02 NOTE — Progress Notes (Signed)
Greater than 5 minutes, yet less than 10 minutes of time have been spent researching, coordinating, and implementing care for this patient today.  Thank you for the details you included in the comment boxes. Those details are very helpful in determining the best course of treatment for you and help [place] to provide the best care.  We are sorry that you are not feeling well.  Here is how we plan to help!  Based on what you have shared with me it looks like you mostly have acute back pain.  Acute back pain is defined as musculoskeletal pain that can resolve in 1-3 weeks with conservative treatment.  Some patients experience stomach irritation or in increased heartburn with anti-inflammatory drugs.  Please keep in mind that muscle relaxer's can cause fatigue and should not be taken while at work or driving.  Back pain is very common.  The pain often gets better over time.  The cause of back pain is usually not dangerous.  Most people can learn to manage their back pain on their own.  You may use the Robaxin as you have on file.  I sent a sterapred dose pack taper (prednisone)  Home Care  Stay active.  Start with short walks on flat ground if you can.  Try to walk farther each day.  Do not sit, drive or stand in one place for more than 30 minutes.  Do not stay in bed.  Do not avoid exercise or work.  Activity can help your back heal faster.  Be careful when you bend or lift an object.  Bend at your knees, keep the object close to you, and do not twist.  Sleep on a firm mattress.  Lie on your side, and bend your knees.  If you lie on your back, put a pillow under your knees.  Only take medicines as told by your doctor.  Put ice on the injured area.  Put ice in a plastic bag  Place a towel between your skin and the bag  Leave the ice on for 15-20 minutes, 3-4 times a day for the first 2-3 days. 210 After that, you can switch between ice and heat packs.  Ask your doctor about back  exercises or massage.  Avoid feeling anxious or stressed.  Find good ways to deal with stress, such as exercise.  Get Help Right Way If:  Your pain does not go away with rest or medicine.  Your pain does not go away in 1 week.  You have new problems.  You do not feel well.  The pain spreads into your legs.  You cannot control when you poop (bowel movement) or pee (urinate)  You feel sick to your stomach (nauseous) or throw up (vomit)  You have belly (abdominal) pain.  You feel like you may pass out (faint).  If you develop a fever.  Make Sure you:  Understand these instructions.  Will watch your condition  Will get help right away if you are not doing well or get worse.  Your e-visit answers were reviewed by a board certified advanced clinical practitioner to complete your personal care plan.  Depending on the condition, your plan could have included both over the counter or prescription medications.  If there is a problem please reply  once you have received a response from your provider.  Your safety is important to [place].  If you have drug allergies check your prescription carefully.    You can use MyChart to  ask questions about today's visit, request a non-urgent call back, or ask for a work or school excuse for 24 hours related to this e-Visit. If it has been greater than 24 hours you will need to follow up with your provider, or enter a new e-Visit to address those concerns.  You will get an e-mail in the next two days asking about your experience.  I hope that your e-visit has been valuable and will speed your recovery. Thank you for using e-visits.

## 2018-04-18 MED FILL — buPROPion HCL ER (XL) 300 M: 300 | 90 days supply | Qty: 90 | Fill #1

## 2018-04-21 ENCOUNTER — Ambulatory Visit: Payer: 59 | Admitting: Internal Medicine

## 2018-04-21 ENCOUNTER — Encounter: Payer: Self-pay | Admitting: Internal Medicine

## 2018-04-21 VITALS — BP 120/78 | HR 66 | Ht 71.0 in | Wt 217.2 lb

## 2018-04-21 DIAGNOSIS — G4733 Obstructive sleep apnea (adult) (pediatric): Secondary | ICD-10-CM | POA: Diagnosis not present

## 2018-04-21 DIAGNOSIS — M5440 Lumbago with sciatica, unspecified side: Secondary | ICD-10-CM

## 2018-04-21 NOTE — Assessment & Plan Note (Signed)
He benefits from CPAP with improved sleep.  We discussed compliance goals and comfort issues. Plan-continue CPAP 7 for now.  When eligible for new machine, we will replace with AutoPap 5-15

## 2018-04-21 NOTE — Progress Notes (Signed)
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 Pt wears CPAP nightly and DL attached. No new supplies needed at this time.  Download 80% compliance, AHI 1.1/hour.  He and his wife both sleep better using CPAP. Perennial mild nasal stuffiness.  Breathe Right strips do not help.  He uses 1 puff of Afrin, alternating nostrils every other night and is aware of potential for rebound. Denies other active health problems of concern.  04/21/2018- 48 year old male never smoker, [employer] Pharmac[employer], followed for OSA, nasal congestion, complicated by low back pain, depression, rhinitis,  CPAP 7/Advanced Download 77% compliance, AHI 1.2/ hr -----Pt states he has been doing well since last visit and states CPAP has been working well. Body weight today 217 lbs He would like to replace his old CPAP machine when eligible-in October according to his DME company.  I discussed conversion to AutoPap at that time.  We reviewed compliance goals.  He admits he sleeps better with CPAP. Sleep is been somewhat disturbed by back pain and he admits he is not getting the exercise he is supposed to have to manage that problem.  I offered a contact for PT-Pilates that he could look into.  ROS-see HPI  + = positive Constitutional:   No-   weight loss, night sweats, fevers, chills, fatigue, lassitude. HEENT:   No-  headaches, difficulty swallowing, tooth/dental problems, sore throat,       No-  sneezing, itching, ear ache, + nasal congestion, post nasal drip,  CV:  No-   chest pain, orthopnea, PND, swelling in lower extremities, anasarca,                                                    dizziness, palpitations Resp: No-   shortness of breath with exertion or at rest.              No-   productive cough,  No non-productive cough,  No- coughing up of blood.              No-   change in color of mucus.  No- wheezing.   Skin: No-   rash or lesions. GI:  No-   heartburn, indigestion, abdominal pain, nausea, vomiting,  GU:  MS:  No-   joint pain or swelling.   Neuro-     nothing unusual Psych:  No- change in mood or affect. + depression or anxiety.  No memory loss.  OBJ- Physical Exam  +stable exam at this visit with no acute changes General- Alert, Oriented, Affect-appropriate, Distress- none acute, medium build Skin- rash-none, lesions- none, excoriation- none Lymphadenopathy- none Head- atraumatic            Eyes- Gross vision intact, PERRLA, conjunctivae and secretions clear            Ears- Hearing, canals-normal            Nose- Clear, no-Septal dev, mucus, polyps, erosion, perforation             Throat- +Small mandible, Mallampati III , mucosa clear , drainage- none, tonsils- atrophic Neck- flexible , trachea  midline, no stridor , thyroid nl, carotid no bruit Chest - symmetrical excursion , unlabored           Heart/CV- RRR , no murmur , no gallop  , no rub, nl s1 s2                           - JVD- none , edema- none, stasis changes- none, varices- none           Lung- clear to P&A, wheeze- none, cough- none , dullness-none, rub- none           Chest wall-  Abd-  Br/ Gen/ Rectal- Not done, not indicated Extrem- cyanosis- none, clubbing, none, atrophy- none, strength- nl Neuro- grossly intact to observation

## 2018-04-21 NOTE — Patient Instructions (Signed)
We can continue CPAP 7, mask of choice, humidifier, supplies, [place]  Please call in October and we can order a replacement for your old CPAP machine. I anticipate changing then to an autopap machine that can self-adjust.  I also recommend you call Leatrice Jewels, PT who runs a series of PT-Pilates groups. This can really help with back pain problems and is low-key and friendly. [phone] [email or web address]

## 2018-04-21 NOTE — Assessment & Plan Note (Signed)
He had been instructed to exercise for management of the pain but admits he has not been doing it.  I gave him contact information for a PT-Pilates program that might meet his needs.

## 2018-05-18 MED FILL — ESCITALOPRAM 10 MG TABLET: 10 | 90 days supply | Qty: 90 | Fill #1

## 2018-07-17 MED FILL — buPROPion HCL ER (XL) 300 M: 300 | 90 days supply | Qty: 90 | Fill #2

## 2018-08-03 ENCOUNTER — Other Ambulatory Visit: Payer: Self-pay | Admitting: Family Medicine

## 2018-08-03 MED FILL — valACYclovir HCL 1 GM TABS: 1 | 1 days supply | Qty: 4 | Fill #0

## 2018-08-11 DIAGNOSIS — G4733 Obstructive sleep apnea (adult) (pediatric): Secondary | ICD-10-CM | POA: Diagnosis not present

## 2018-08-18 MED FILL — valACYclovir HCL 1 GM TABS: 1 | 1 days supply | Qty: 4 | Fill #1

## 2018-08-18 MED FILL — ESCITALOPRAM 10 MG TABLET: 10 | 90 days supply | Qty: 90 | Fill #2

## 2018-10-17 MED FILL — buPROPion HCL ER (XL) 300 M: 300 | 90 days supply | Qty: 90 | Fill #3

## 2018-11-19 MED FILL — ESCITALOPRAM 10 MG TABLET: 10 | 90 days supply | Qty: 90 | Fill #3

## 2018-12-21 DIAGNOSIS — H5213 Myopia, bilateral: Secondary | ICD-10-CM | POA: Diagnosis not present

## 2018-12-22 DIAGNOSIS — G4733 Obstructive sleep apnea (adult) (pediatric): Secondary | ICD-10-CM | POA: Diagnosis not present

## 2018-12-28 ENCOUNTER — Other Ambulatory Visit: Payer: 59

## 2019-01-03 ENCOUNTER — Encounter: Payer: 59 | Admitting: Family Medicine

## 2019-01-07 ENCOUNTER — Encounter: Payer: Self-pay | Admitting: Family Medicine

## 2019-01-08 ENCOUNTER — Other Ambulatory Visit: Payer: Self-pay | Admitting: *Deleted

## 2019-01-08 DIAGNOSIS — Z20822 Contact with and (suspected) exposure to covid-19: Secondary | ICD-10-CM

## 2019-01-08 DIAGNOSIS — Z20828 Contact with and (suspected) exposure to other viral communicable diseases: Secondary | ICD-10-CM

## 2019-01-10 LAB — NOVEL CORONAVIRUS, NAA: SARS-CoV-2, NAA: NOT DETECTED

## 2019-01-11 ENCOUNTER — Telehealth: Payer: Self-pay | Admitting: Family Medicine

## 2019-01-11 DIAGNOSIS — Z125 Encounter for screening for malignant neoplasm of prostate: Secondary | ICD-10-CM

## 2019-01-11 DIAGNOSIS — Z0000 Encounter for general adult medical examination without abnormal findings: Secondary | ICD-10-CM

## 2019-01-11 NOTE — Telephone Encounter (Signed)
-----   Message from Ellamae Sia sent at 01/04/2019 10:52 AM EST ----- Regarding: Lab orders for Wednesday, 11.18.20 Patient is scheduled for CPX labs, please order future labs, Thanks , Karna Christmas

## 2019-01-12 ENCOUNTER — Other Ambulatory Visit (INDEPENDENT_AMBULATORY_CARE_PROVIDER_SITE_OTHER): Payer: 59

## 2019-01-12 ENCOUNTER — Other Ambulatory Visit: Payer: Self-pay

## 2019-01-12 DIAGNOSIS — Z0000 Encounter for general adult medical examination without abnormal findings: Secondary | ICD-10-CM

## 2019-01-12 DIAGNOSIS — Z125 Encounter for screening for malignant neoplasm of prostate: Secondary | ICD-10-CM | POA: Diagnosis not present

## 2019-01-12 LAB — CBC WITH DIFFERENTIAL/PLATELET
Basophils Absolute: 0.1 10*3/uL (ref 0.0–0.1)
Basophils Relative: 1 % (ref 0.0–3.0)
Eosinophils Absolute: 0.2 10*3/uL (ref 0.0–0.7)
Eosinophils Relative: 2.8 % (ref 0.0–5.0)
HCT: 45 % (ref 39.0–52.0)
Hemoglobin: 15 g/dL (ref 13.0–17.0)
Lymphocytes Relative: 30.4 % (ref 12.0–46.0)
Lymphs Abs: 1.6 10*3/uL (ref 0.7–4.0)
MCHC: 33.4 g/dL (ref 30.0–36.0)
MCV: 90.7 fl (ref 78.0–100.0)
Monocytes Absolute: 0.6 10*3/uL (ref 0.1–1.0)
Monocytes Relative: 10.9 % (ref 3.0–12.0)
Neutro Abs: 2.9 10*3/uL (ref 1.4–7.7)
Neutrophils Relative %: 54.9 % (ref 43.0–77.0)
Platelets: 246 10*3/uL (ref 150.0–400.0)
RBC: 4.96 Mil/uL (ref 4.22–5.81)
RDW: 14.4 % (ref 11.5–15.5)
WBC: 5.3 10*3/uL (ref 4.0–10.5)

## 2019-01-12 LAB — LIPID PANEL
Cholesterol: 186 mg/dL (ref 0–200)
HDL: 63.1 mg/dL (ref >39.00)
LDL Cholesterol: 106 mg/dL — ABNORMAL HIGH (ref 0–99)
NonHDL: 122.98
Total CHOL/HDL Ratio: 3
Triglycerides: 85 mg/dL (ref 0.0–149.0)
VLDL: 17 mg/dL (ref 0.0–40.0)

## 2019-01-12 LAB — COMPREHENSIVE METABOLIC PANEL
ALT: 18 U/L (ref 0–53)
AST: 20 U/L (ref 0–37)
Albumin: 4.5 g/dL (ref 3.5–5.2)
Alkaline Phosphatase: 50 U/L (ref 39–117)
BUN: 9 mg/dL (ref 6–23)
CO2: 31 mEq/L (ref 19–32)
Calcium: 9.7 mg/dL (ref 8.4–10.5)
Chloride: 100 mEq/L (ref 96–112)
Creatinine, Ser: 0.92 mg/dL (ref 0.40–1.50)
GFR: 87.23 mL/min (ref >60.00)
Glucose, Bld: 94 mg/dL (ref 70–99)
Potassium: 4.7 mEq/L (ref 3.5–5.1)
Sodium: 138 mEq/L (ref 135–145)
Total Bilirubin: 0.6 mg/dL (ref 0.2–1.2)
Total Protein: 6.8 g/dL (ref 6.0–8.3)

## 2019-01-12 LAB — PSA: PSA: 1.29 ng/mL (ref 0.10–4.00)

## 2019-01-12 LAB — TSH: TSH: 2.21 u[IU]/mL (ref 0.35–4.50)

## 2019-01-13 ENCOUNTER — Other Ambulatory Visit: Payer: Self-pay

## 2019-01-13 ENCOUNTER — Encounter: Payer: Self-pay | Admitting: Family Medicine

## 2019-01-13 ENCOUNTER — Ambulatory Visit (INDEPENDENT_AMBULATORY_CARE_PROVIDER_SITE_OTHER): Payer: 59 | Admitting: Family Medicine

## 2019-01-13 VITALS — BP 106/64 | HR 61 | Temp 96.8°F | Ht 71.25 in | Wt 209.0 lb

## 2019-01-13 DIAGNOSIS — F418 Other specified anxiety disorders: Secondary | ICD-10-CM

## 2019-01-13 DIAGNOSIS — Z125 Encounter for screening for malignant neoplasm of prostate: Secondary | ICD-10-CM

## 2019-01-13 DIAGNOSIS — Z0000 Encounter for general adult medical examination without abnormal findings: Secondary | ICD-10-CM

## 2019-01-13 MED ORDER — BUPROPION HCL ER (XL) 300 MG PO TB24: 300.0000 mg | ORAL_TABLET | Freq: Every day | ORAL | 3 refills | Status: DC

## 2019-01-13 MED ORDER — ESCITALOPRAM OXALATE 10 MG PO TABS: 10.0000 mg | ORAL_TABLET | Freq: Every day | ORAL | 3 refills | Status: DC

## 2019-01-13 MED FILL — BUPROPION HCL ER (XL) 300 M: 300 | 30 days supply | Qty: 30 | Fill #0

## 2019-01-13 NOTE — Assessment & Plan Note (Signed)
More anxiety lately in the setting of pandemic /stressors  Considering counseling  Disc role of exercise  Suggested meditation

## 2019-01-13 NOTE — Patient Instructions (Addendum)
When you turn 49 check in to the coverage of shingrix vaccine    Keep brainstorming ways to deal with anxiety  Look into phone apps like Calm/ Headspace/ 10% happier   Take care of yourself  Try to keep up some exercise   Try to limit alcohol to 2 servings per day

## 2019-01-13 NOTE — Assessment & Plan Note (Signed)
No clinical changes  MGF had prostate cancer  Lab Results  Component Value Date   PSA 1.29 01/12/2019   PSA 1.11 12/28/2017   PSA 0.48 12/23/2016

## 2019-01-13 NOTE — Progress Notes (Signed)
Subjective:    Patient ID: Eric Blackburn, male    DOB: [birth year], 48 y.o.   MRN: [id number]  HPI  Here for health maintenance exam and to review chronic medical problems   Feeling ok but anxious with the world events /covid  He works at health care - complicated  That makes it harder to concentrate  Hard to not leave work at work     [employer] from Last 3 Encounters:  01/13/19 209 lb (94.8 kg)  04/21/18 217 lb 3.2 oz (98.5 kg)  12/28/17 213 lb 4 oz (96.7 kg)  has been going on back packing trips this fall Lot of hikes  Taking care of himself  Has lost weight  Would like to gain some muscle - does like yoga  28.95 kg/m   In a trial for the covid vaccine (phizer)   Tdap 4/15  Flu shot 10/20   Prostate health  MGF with prostate cancer   Lab Results  Component Value Date   PSA 1.29 01/12/2019   PSA 1.11 12/28/2017   PSA 0.48 12/23/2016  nocturia -not often   H/o hypospadias- no changes /has to relax to start urination   Zoster status- has not had shingles   Dermatology f/u -not in the last year  No new or changing moles  Good about sunscreen use   H/o depression /mood Takes lexapro and wellbutrin  Does not want to change Issues are situational   Moderate alcohol intake     BP Readings from Last 3 Encounters:  01/13/19 106/64  04/21/18 120/78  02/18/18 122/83   Pulse Readings from Last 3 Encounters:  01/13/19 61  04/21/18 66  02/18/18 62   Cholesterol Lab Results  Component Value Date   CHOL 186 01/12/2019   CHOL 176 12/28/2017   CHOL 178 12/23/2016   Lab Results  Component Value Date   HDL 63.10 01/12/2019   HDL 59.30 12/28/2017   HDL 59.50 12/23/2016   Lab Results  Component Value Date   LDLCALC 106 (H) 01/12/2019   LDLCALC 101 (H) 12/28/2017   LDLCALC 102 (H) 12/23/2016   Lab Results  Component Value Date   TRIG 85.0 01/12/2019   TRIG 80.0 12/28/2017   TRIG 81.0 12/23/2016   Lab Results  Component Value Date   CHOLHDL 3 01/12/2019   CHOLHDL 3 12/28/2017   CHOLHDL 3 12/23/2016   No results found for: LDLDIRECT  Other labs  Results for orders placed or performed in visit on 01/12/19  PSA  Result Value Ref Range   PSA 1.29 0.10 - 4.00 ng/mL  TSH  Result Value Ref Range   TSH 2.21 0.35 - 4.50 uIU/mL  Lipid panel  Result Value Ref Range   Cholesterol 186 0 - 200 mg/dL   Triglycerides 85.0 0.0 - 149.0 mg/dL   HDL 63.10 >39.00 mg/dL   VLDL 17.0 0.0 - 40.0 mg/dL   LDL Cholesterol 106 (H) 0 - 99 mg/dL   Total CHOL/HDL Ratio 3    NonHDL 122.98   CBC with Differential  Result Value Ref Range   WBC 5.3 4.0 - 10.5 K/uL   RBC 4.96 4.22 - 5.81 Mil/uL   Hemoglobin 15.0 13.0 - 17.0 g/dL   HCT 45.0 39.0 - 52.0 %   MCV 90.7 78.0 - 100.0 fl   MCHC 33.4 30.0 - 36.0 g/dL   RDW 14.4 11.5 - 15.5 %   Platelets 246.0 150.0 - 400.0 K/uL   Neutrophils Relative % 54.9  43.0 - 77.0 %   Lymphocytes Relative 30.4 12.0 - 46.0 %   Monocytes Relative 10.9 3.0 - 12.0 %   Eosinophils Relative 2.8 0.0 - 5.0 %   Basophils Relative 1.0 0.0 - 3.0 %   Neutro Abs 2.9 1.4 - 7.7 K/uL   Lymphs Abs 1.6 0.7 - 4.0 K/uL   Monocytes Absolute 0.6 0.1 - 1.0 K/uL   Eosinophils Absolute 0.2 0.0 - 0.7 K/uL   Basophils Absolute 0.1 0.0 - 0.1 K/uL  Comprehensive metabolic panel  Result Value Ref Range   Sodium 138 135 - 145 mEq/L   Potassium 4.7 3.5 - 5.1 mEq/L   Chloride 100 96 - 112 mEq/L   CO2 31 19 - 32 mEq/L   Glucose, Bld 94 70 - 99 mg/dL   BUN 9 6 - 23 mg/dL   Creatinine, Ser 0.92 0.40 - 1.50 mg/dL   Total Bilirubin 0.6 0.2 - 1.2 mg/dL   Alkaline Phosphatase 50 39 - 117 U/L   AST 20 0 - 37 U/L   ALT 18 0 - 53 U/L   Total Protein 6.8 6.0 - 8.3 g/dL   Albumin 4.5 3.5 - 5.2 g/dL   GFR 87.23 >60.00 mL/min   Calcium 9.7 8.4 - 10.5 mg/dL      Patient Active Problem List   Diagnosis Date Noted  . Prostate cancer screening 09/10/2015  . Rhinitis, nonallergic 04/16/2015  . Obstructive sleep apnea 05/25/2013  .  Routine general medical examination at a health care facility 05/16/2013  . Back pain 02/29/2008  . Depression 03/31/2007  . HYPOSPADIAS 03/31/2007   Past Medical History:  Diagnosis Date  . Depression    Past Surgical History:  Procedure Laterality Date  . ANTERIOR CRUCIATE LIGAMENT REPAIR  1992   Right  . HERNIA REPAIR     x 2 as a child   Social History   Tobacco Use  . Smoking status: Never Smoker  . Smokeless tobacco: Never Used  Substance Use Topics  . Alcohol use: Yes    Alcohol/week: 0.0 standard drinks    Comment: 1-4 drinks daily (beer)  . Drug use: No   Family History  Problem Relation Age of Onset  . Depression Mother   . Diabetes type II Mother   . Emphysema Maternal Grandfather   . Prostate cancer Maternal Grandfather   . Asthma Sister        developed as a child  . Cancer Paternal Grandmother   . Lung cancer Sister        small cell    No Known Allergies Current Outpatient Medications on File Prior to Visit  Medication Sig Dispense Refill  . Ascorbic Acid (VITAMIN C) 1000 MG tablet Take 1,000 mg by mouth daily.    Marland Kitchen ibuprofen (ADVIL) 200 MG tablet Take 400-800 mg by mouth every 6 (six) hours as needed (pain).    . methocarbamol (ROBAXIN) 500 MG tablet Take 1 tablet (500 mg total) by mouth at bedtime as needed for muscle spasms. 30 tablet 1  . Multiple Vitamin (MULTIVITAMIN) capsule Take 1 capsule by mouth daily.     Marland Kitchen oxymetazoline (AFRIN) 0.05 % nasal spray Place 1 spray into both nostrils at bedtime. Alternate nostril    . valACYclovir (VALTREX) 1000 MG tablet TAKE 2 TABLETS BY MOUTH TWO TIMES A DAY FOR 1 DAY FOR COLD SORE AS NEEDED. 4 tablet 5   No current facility-administered medications on file prior to visit.     Review of Systems  Constitutional: Negative for activity change, appetite change, fatigue, fever and unexpected weight change.  HENT: Negative for congestion, rhinorrhea, sore throat and trouble swallowing.   Eyes: Negative for  pain, redness, itching and visual disturbance.  Respiratory: Negative for cough, chest tightness, shortness of breath and wheezing.   Cardiovascular: Negative for chest pain and palpitations.  Gastrointestinal: Negative for abdominal pain, blood in stool, constipation, diarrhea and nausea.  Endocrine: Negative for cold intolerance, heat intolerance, polydipsia and polyuria.  Genitourinary: Negative for difficulty urinating, dysuria, frequency and urgency.  Musculoskeletal: Negative for arthralgias, joint swelling and myalgias.  Skin: Negative for pallor and rash.  Neurological: Negative for dizziness, tremors, weakness, numbness and headaches.  Hematological: Negative for adenopathy. Does not bruise/bleed easily.  Psychiatric/Behavioral: Negative for decreased concentration and dysphoric mood. The patient is nervous/anxious.        Objective:   Physical Exam Constitutional:      General: He is not in acute distress.    Appearance: Normal appearance. He is well-developed. He is not ill-appearing or diaphoretic.  HENT:     Head: Normocephalic and atraumatic.     Right Ear: Tympanic membrane, ear canal and external ear normal.     Left Ear: Tympanic membrane, ear canal and external ear normal.     Nose: Nose normal. No congestion.     Mouth/Throat:     Mouth: Mucous membranes are moist.     Pharynx: Oropharynx is clear. No posterior oropharyngeal erythema.  Eyes:     General: No scleral icterus.       Right eye: No discharge.        Left eye: No discharge.     Conjunctiva/sclera: Conjunctivae normal.     Pupils: Pupils are equal, round, and reactive to light.  Neck:     Musculoskeletal: Normal range of motion and neck supple. No neck rigidity or muscular tenderness.     Thyroid: No thyromegaly.     Vascular: No carotid bruit or JVD.  Cardiovascular:     Rate and Rhythm: Normal rate and regular rhythm.     Pulses: Normal pulses.     Heart sounds: Normal heart sounds. No gallop.    Pulmonary:     Effort: Pulmonary effort is normal. No respiratory distress.     Breath sounds: Normal breath sounds. No wheezing or rales.     Comments: Good air exch Chest:     Chest wall: No tenderness.  Abdominal:     General: Bowel sounds are normal. There is no distension or abdominal bruit.     Palpations: Abdomen is soft. There is no mass.     Tenderness: There is no abdominal tenderness.     Hernia: No hernia is present.  Musculoskeletal:        General: No tenderness.     Right lower leg: No edema.     Left lower leg: No edema.  Lymphadenopathy:     Cervical: No cervical adenopathy.  Skin:    General: Skin is warm and dry.     Coloration: Skin is not pale.     Findings: No erythema or rash.     Comments: Scattered lentigines and brown nevi  Neurological:     Mental Status: He is alert.     Cranial Nerves: No cranial nerve deficit.     Motor: No abnormal muscle tone.     Coordination: Coordination normal.     Gait: Gait normal.     Deep Tendon Reflexes: Reflexes are normal  and symmetric. Reflexes normal.  Psychiatric:        Mood and Affect: Mood normal.        Cognition and Memory: Cognition and memory normal.     Comments: Not anxious during interview pleasant           Assessment & Plan:   Problem List Items Addressed This Visit      Other   Depression with anxiety    More anxiety lately in the setting of pandemic /stressors  Considering counseling  Disc role of exercise  Suggested meditation       Relevant Medications   buPROPion (WELLBUTRIN XL) 300 MG 24 hr tablet   escitalopram (LEXAPRO) 10 MG tablet   Routine general medical examination at a health care facility - Primary    Reviewed health habits including diet and exercise and skin cancer prevention Reviewed appropriate screening tests for age  Also reviewed health mt list, fam hx and immunization status , as well as social and family history   See HPI Labs reviewed  Enc healthy  diet/exercise Consider shingrix vaccine at age 55  Limit alcohol servings to 2 per day      Prostate cancer screening    No clinical changes  MGF had prostate cancer  Lab Results  Component Value Date   PSA 1.29 01/12/2019   PSA 1.11 12/28/2017   PSA 0.48 12/23/2016

## 2019-01-13 NOTE — Assessment & Plan Note (Signed)
Reviewed health habits including diet and exercise and skin cancer prevention Reviewed appropriate screening tests for age  Also reviewed health mt list, fam hx and immunization status , as well as social and family history   See HPI Labs reviewed  Enc healthy diet/exercise Consider shingrix vaccine at age 48  Limit alcohol servings to 2 per day

## 2019-02-07 NOTE — Telephone Encounter (Signed)
Patient sent this message to you this morning. Patient last OV 04/21/18. Patient DME [facility]   Dr. Annamaria Boots,  I would like to get a new cpap machine. My current one still works but I would like a Advertising account executive one if my insurance will pay for it.  Thanks,  [employer] routed to Dr. Annamaria Boots to advise

## 2019-02-07 NOTE — Telephone Encounter (Signed)
If the current machine is at least 49 years old, there should be no problem getting an update. Insurances routinely use that as the replacement time. He can check with his DME and f they say he is eligible, I will be happy to order replacement.

## 2019-02-21 MED FILL — ESCITALOPRAM 10 MG TABLET: 10 | 90 days supply | Qty: 90 | Fill #0

## 2019-02-22 ENCOUNTER — Encounter: Payer: Self-pay | Admitting: Family Medicine

## 2019-02-22 DIAGNOSIS — G4733 Obstructive sleep apnea (adult) (pediatric): Secondary | ICD-10-CM

## 2019-02-22 DIAGNOSIS — M25511 Pain in right shoulder: Secondary | ICD-10-CM

## 2019-02-22 DIAGNOSIS — M25519 Pain in unspecified shoulder: Secondary | ICD-10-CM | POA: Insufficient documentation

## 2019-02-22 DIAGNOSIS — M25512 Pain in left shoulder: Secondary | ICD-10-CM

## 2019-02-22 NOTE — Telephone Encounter (Signed)
Ok to order DME [facility]- please replace old CPAP machine, change to auto 5-15, mask of choice, humidifier, supplies, AirView/ card

## 2019-02-22 NOTE — Telephone Encounter (Signed)
I placed an orthopedic referral  Will route to [facility]

## 2019-02-22 NOTE — Telephone Encounter (Signed)
Received the following message from patient:  "I checked with my insurance and they said they would cover a new CPAP machine. Can you order one for me or do you want to wait for my annual visit with you this Spring? Thanks for your time."  Patient is scheduled for his 28yr follow up on February 26th.   Dr. Annamaria Boots, please advise if you are ok with placing an order for a replacement CPAP machine before his visit. Thanks!

## 2019-02-23 NOTE — Telephone Encounter (Signed)
Order sent to [facility] for pt to have old cpap machine replaced and with the setting info that CY stated. Nothing further needed.

## 2019-02-23 NOTE — Addendum Note (Signed)
Addended by: Lorretta Harp on: 02/23/2019 08:48 AM   Modules accepted: Orders

## 2019-02-27 ENCOUNTER — Encounter: Payer: Self-pay | Admitting: Internal Medicine

## 2019-02-28 DIAGNOSIS — G4733 Obstructive sleep apnea (adult) (pediatric): Secondary | ICD-10-CM | POA: Diagnosis not present

## 2019-03-03 MED FILL — BUPROPION HCL ER (XL) 300 M: 300 | 30 days supply | Qty: 30 | Fill #1

## 2019-03-04 DIAGNOSIS — M25511 Pain in right shoulder: Secondary | ICD-10-CM | POA: Diagnosis not present

## 2019-03-04 DIAGNOSIS — M7541 Impingement syndrome of right shoulder: Secondary | ICD-10-CM | POA: Diagnosis not present

## 2019-03-16 DIAGNOSIS — G4733 Obstructive sleep apnea (adult) (pediatric): Secondary | ICD-10-CM | POA: Diagnosis not present

## 2019-03-30 MED FILL — BUPROPION HCL XL 300 MG TAB: 300 | 30 days supply | Qty: 30 | Fill #2

## 2019-03-31 DIAGNOSIS — G4733 Obstructive sleep apnea (adult) (pediatric): Secondary | ICD-10-CM | POA: Diagnosis not present

## 2019-04-01 DIAGNOSIS — M7541 Impingement syndrome of right shoulder: Secondary | ICD-10-CM | POA: Diagnosis not present

## 2019-04-01 DIAGNOSIS — M25511 Pain in right shoulder: Secondary | ICD-10-CM | POA: Diagnosis not present

## 2019-04-04 ENCOUNTER — Encounter: Payer: Self-pay | Admitting: Family Medicine

## 2019-04-04 DIAGNOSIS — R4589 Other symptoms and signs involving emotional state: Secondary | ICD-10-CM | POA: Insufficient documentation

## 2019-04-04 NOTE — Telephone Encounter (Signed)
Referral for counseling  Will send to [facility] Pt prefers external

## 2019-04-07 ENCOUNTER — Other Ambulatory Visit (HOSPITAL_COMMUNITY): Payer: Self-pay | Admitting: Orthopedic Surgery

## 2019-04-07 ENCOUNTER — Other Ambulatory Visit: Payer: Self-pay | Admitting: Orthopedic Surgery

## 2019-04-07 DIAGNOSIS — M25511 Pain in right shoulder: Secondary | ICD-10-CM

## 2019-04-20 ENCOUNTER — Other Ambulatory Visit: Payer: Self-pay

## 2019-04-20 ENCOUNTER — Ambulatory Visit (HOSPITAL_COMMUNITY)
Admission: RE | Admit: 2019-04-20 | Discharge: 2019-04-20 | Disposition: A | Discharge status: Home / Self Care | Payer: 59 | Source: Ambulatory Visit | Attending: Orthopedic Surgery | Admitting: Orthopedic Surgery

## 2019-04-20 DIAGNOSIS — M25511 Pain in right shoulder: Secondary | ICD-10-CM | POA: Diagnosis not present

## 2019-04-22 ENCOUNTER — Other Ambulatory Visit: Payer: Self-pay

## 2019-04-22 ENCOUNTER — Ambulatory Visit (INDEPENDENT_AMBULATORY_CARE_PROVIDER_SITE_OTHER): Payer: 59 | Admitting: Internal Medicine

## 2019-04-22 ENCOUNTER — Encounter: Payer: Self-pay | Admitting: Internal Medicine

## 2019-04-22 DIAGNOSIS — G4733 Obstructive sleep apnea (adult) (pediatric): Secondary | ICD-10-CM | POA: Diagnosis not present

## 2019-04-22 DIAGNOSIS — R4589 Other symptoms and signs involving emotional state: Secondary | ICD-10-CM

## 2019-04-22 NOTE — Assessment & Plan Note (Signed)
Recognize potential symptom overlap with fatigue if CPAP is not controlling.

## 2019-04-22 NOTE — Progress Notes (Signed)
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 Body weight today 217 lbs He would like to replace his old CPAP machine when eligible-in October according to his DME company.  I discussed conversion to AutoPap at that time.  We reviewed compliance goals.  He admits he sleeps better with CPAP. Sleep is been somewhat disturbed by back pain and he admits he is not getting the exercise he is supposed to have to manage that problem.  I offered a contact for PT[facility] that he could look into.  04/22/19-  49 year old male never smoker, [employer] Pharmac[employer], followed for OSA, nasal congestion, complicated by low back pain, depression, rhinitis,  CPAP 7/Advanced -----f/u OSA  Body weight today 220 lbs Download compliance 77%, AHI 0.8/ hr Comfortable with CPAP. His wife tells him she is hearing noise which may be breakthrough snoring. We discussed changing to autopap, since his replacement machine, new to him in January, is an Autoset 10. Had Covid vax Phizer x 2.  ROS-see HPI  + = positive Constitutional:   No-   weight loss, night sweats, fevers, chills, fatigue, lassitude. HEENT:   No-  headaches, difficulty swallowing, tooth/dental problems, sore throat,       No-  sneezing, itching, ear ache, + nasal congestion, post nasal drip,  CV:  No-   chest pain, orthopnea, PND, swelling in lower extremities, anasarca,                                                    dizziness, palpitations Resp: No-   shortness of breath with exertion or at rest.              No-   productive cough,  No non-productive cough,  No- coughing up of blood.              No-   change in color of mucus.  No- wheezing.   Skin: No-   rash or lesions. GI:  No-   heartburn, indigestion, abdominal pain, nausea, vomiting,  GU:  MS:  No-   joint pain or swelling.   Neuro-     nothing unusual Psych:  No- change in mood or affect. + depression or anxiety.  No memory loss.  OBJ- Physical Exam  +stable exam at this visit with no acute changes General- Alert, Oriented, Affect-appropriate, Distress- none acute, medium build Skin- rash-none, lesions- none, excoriation- none Lymphadenopathy- none Head- atraumatic            Eyes- Gross vision intact, PERRLA, conjunctivae and secretions clear            Ears- Hearing, canals-normal            Nose- Clear, no-Septal dev, mucus, polyps, erosion, perforation             Throat- +Small mandible, Mallampati III , mucosa clear , drainage- none, tonsils- atrophic Neck- flexible , trachea midline, no stridor , thyroid nl,  carotid no bruit Chest - symmetrical excursion , unlabored           Heart/CV- RRR , no murmur , no gallop  , no rub, nl s1 s2                           - JVD- none , edema- none, stasis changes- none, varices- none           Lung- clear to P&A, wheeze- none, cough- none , dullness-none, rub- none           Chest wall-  Abd-  Br/ Gen/ Rectal- Not done, not indicated Extrem- cyanosis- none, clubbing, none, atrophy- none, strength- nl Neuro- grossly intact to observation

## 2019-04-22 NOTE — Assessment & Plan Note (Signed)
Benefits from CPAP. Replaced machine Jan, 2021. Plan- change to autopap 5-15

## 2019-04-22 NOTE — Patient Instructions (Signed)
Order- DME [facility]   Please change CPAP to auto 5-15, continue mask of choice, humidifier, supplies, Airview/ card  Please call if we can help

## 2019-04-28 DIAGNOSIS — G4733 Obstructive sleep apnea (adult) (pediatric): Secondary | ICD-10-CM | POA: Diagnosis not present

## 2019-05-02 MED FILL — buPROPion HCL ER (XL) 300 M: 300 | 30 days supply | Qty: 30 | Fill #3

## 2019-05-03 MED FILL — valACYclovir HCL 1 GM TABS: 1 | 1 days supply | Qty: 4 | Fill #2

## 2019-05-11 DIAGNOSIS — M7541 Impingement syndrome of right shoulder: Secondary | ICD-10-CM | POA: Diagnosis not present

## 2019-05-11 MED FILL — NAPROXEN 500 MG TABS: 500 | 30 days supply | Qty: 60 | Fill #0

## 2019-05-18 DIAGNOSIS — M7541 Impingement syndrome of right shoulder: Secondary | ICD-10-CM | POA: Diagnosis not present

## 2019-05-19 DIAGNOSIS — F4323 Adjustment disorder with mixed anxiety and depressed mood: Secondary | ICD-10-CM | POA: Diagnosis not present

## 2019-05-23 MED FILL — ESCITALOPRAM 10 MG TABLET: 10 | 90 days supply | Qty: 90 | Fill #1

## 2019-05-25 DIAGNOSIS — F4323 Adjustment disorder with mixed anxiety and depressed mood: Secondary | ICD-10-CM | POA: Diagnosis not present

## 2019-05-29 DIAGNOSIS — G4733 Obstructive sleep apnea (adult) (pediatric): Secondary | ICD-10-CM | POA: Diagnosis not present

## 2019-05-30 MED FILL — buPROPion HCL ER (XL) 300 M: 300 | 30 days supply | Qty: 30 | Fill #4

## 2019-05-31 DIAGNOSIS — G4733 Obstructive sleep apnea (adult) (pediatric): Secondary | ICD-10-CM

## 2019-06-01 NOTE — Addendum Note (Signed)
Addended by: Valerie Salts on: 06/01/2019 05:28 PM   Modules accepted: Orders

## 2019-06-01 NOTE — Telephone Encounter (Signed)
Order- DME, please change autopap range to 5-10

## 2019-06-01 NOTE — Telephone Encounter (Signed)
Received the following message from patient:   "Dr. Annamaria Boots,,  Regarding my cpap, my wife is complaining there are more instances now than before of air leaking around my mask and coming out of my mouth. I think this coincides with the setting changes made after my last office visit. Can the settings be adjusted or returned to the previous settings please?  Thanks,  Eric Blackburn"  When I asked him if he had noticed any difference personally, he stated that he had been waking up more at night. I reviewed his chart and it looks like he was originally setup on 7cm back in 2015. He stated that this pressure setting worked great for him. I attempted to get a download from AirView but his machine only provides data up until January 2021.   His DME is listed as [facility].   Dr. Annamaria Boots, please advise. Thanks!

## 2019-06-02 DIAGNOSIS — M7541 Impingement syndrome of right shoulder: Secondary | ICD-10-CM | POA: Diagnosis not present

## 2019-06-10 DIAGNOSIS — G4733 Obstructive sleep apnea (adult) (pediatric): Secondary | ICD-10-CM | POA: Diagnosis not present

## 2019-06-13 DIAGNOSIS — F4323 Adjustment disorder with mixed anxiety and depressed mood: Secondary | ICD-10-CM | POA: Diagnosis not present

## 2019-06-14 MED FILL — NAPROXEN 500 MG TABS: 500 | 30 days supply | Qty: 60 | Fill #1

## 2019-06-21 MED FILL — valACYclovir HCL 1 GM TABS: 1 | 1 days supply | Qty: 4 | Fill #3

## 2019-06-22 DIAGNOSIS — M25511 Pain in right shoulder: Secondary | ICD-10-CM | POA: Diagnosis not present

## 2019-06-22 DIAGNOSIS — M7541 Impingement syndrome of right shoulder: Secondary | ICD-10-CM | POA: Diagnosis not present

## 2019-06-30 MED FILL — buPROPion HCL ER (XL) 300 M: 300 | 30 days supply | Qty: 30 | Fill #5

## 2019-07-02 DIAGNOSIS — G4733 Obstructive sleep apnea (adult) (pediatric): Secondary | ICD-10-CM

## 2019-07-04 NOTE — Telephone Encounter (Signed)
Dr. Annamaria Boots please advise on patients mychart message:  I am still having trouble. My wife says air  leaking out of my mouth a lot. Should I try a mask that covers my nose and mouth?   Thank,  Eric Blackburn

## 2019-07-06 DIAGNOSIS — F4323 Adjustment disorder with mixed anxiety and depressed mood: Secondary | ICD-10-CM | POA: Diagnosis not present

## 2019-07-11 NOTE — Telephone Encounter (Signed)
Called and spoke with patient letting him know we were ordering a mask fitting. Patient expressed understanding and order was placed. Nothing further needed at this time.

## 2019-07-29 MED FILL — valACYclovir HCL 1 GM TABS: 1 | 1 days supply | Qty: 4 | Fill #4

## 2019-07-30 ENCOUNTER — Other Ambulatory Visit (HOSPITAL_COMMUNITY)
Admission: RE | Admit: 2019-07-30 | Discharge: 2019-07-30 | Disposition: A | Discharge status: Home / Self Care | Payer: 59 | Source: Ambulatory Visit | Attending: Internal Medicine | Admitting: Internal Medicine

## 2019-07-30 DIAGNOSIS — Z20822 Contact with and (suspected) exposure to covid-19: Secondary | ICD-10-CM | POA: Diagnosis not present

## 2019-07-30 DIAGNOSIS — Z01812 Encounter for preprocedural laboratory examination: Secondary | ICD-10-CM | POA: Insufficient documentation

## 2019-07-30 LAB — SARS CORONAVIRUS 2 (TAT 6-24 HRS): SARS Coronavirus 2: NEGATIVE

## 2019-08-02 ENCOUNTER — Other Ambulatory Visit: Payer: Self-pay

## 2019-08-02 ENCOUNTER — Ambulatory Visit (HOSPITAL_BASED_OUTPATIENT_CLINIC_OR_DEPARTMENT_OTHER): Payer: 59 | Attending: Internal Medicine | Admitting: Internal Medicine

## 2019-08-02 DIAGNOSIS — G4733 Obstructive sleep apnea (adult) (pediatric): Secondary | ICD-10-CM

## 2019-08-24 DIAGNOSIS — F4323 Adjustment disorder with mixed anxiety and depressed mood: Secondary | ICD-10-CM | POA: Diagnosis not present

## 2019-09-02 MED FILL — buPROPion HCL ER (XL) 300 M: 300 | 30 days supply | Qty: 30 | Fill #7

## 2019-09-07 DIAGNOSIS — G4733 Obstructive sleep apnea (adult) (pediatric): Secondary | ICD-10-CM | POA: Diagnosis not present

## 2019-09-26 DIAGNOSIS — F4323 Adjustment disorder with mixed anxiety and depressed mood: Secondary | ICD-10-CM | POA: Diagnosis not present

## 2019-10-03 MED FILL — buPROPion HCL ER (XL) 300 M: 300 | 30 days supply | Qty: 30 | Fill #8

## 2019-10-18 ENCOUNTER — Encounter: Payer: Self-pay | Admitting: Family Medicine

## 2019-10-19 NOTE — Telephone Encounter (Signed)
I also sent a mychart message letting pt know he needs to make an appt

## 2019-10-19 NOTE — Telephone Encounter (Signed)
I left a message on patient's voice mail to return my call and schedule appointment.

## 2019-10-20 ENCOUNTER — Other Ambulatory Visit: Payer: Self-pay

## 2019-10-20 ENCOUNTER — Other Ambulatory Visit: Payer: Self-pay | Admitting: Family Medicine

## 2019-10-20 ENCOUNTER — Telehealth (INDEPENDENT_AMBULATORY_CARE_PROVIDER_SITE_OTHER): Payer: 59 | Admitting: Family Medicine

## 2019-10-20 ENCOUNTER — Encounter: Payer: Self-pay | Admitting: Family Medicine

## 2019-10-20 DIAGNOSIS — F418 Other specified anxiety disorders: Secondary | ICD-10-CM | POA: Diagnosis not present

## 2019-10-20 MED ORDER — ESCITALOPRAM OXALATE 20 MG PO TABS
20.0000 mg | ORAL_TABLET | Freq: Every day | ORAL | 3 refills | Status: DC
Start: 2019-10-20 — End: 2020-01-16

## 2019-10-20 NOTE — Patient Instructions (Addendum)
Go ahead and increase lexapro to 20 mg once daily  No change in other medicines  Work on adding exercise more often and limit alcohol  Practice good self care  See if you can get some help at work to lighten the load  Please update me in a few weeks or earlier if needed

## 2019-10-20 NOTE — Progress Notes (Signed)
Virtual Visit via Video Note  I connected with Eric Blackburn on 10/20/19 at 11:30 AM EDT by a video enabled telemedicine application and verified that I am speaking with the correct person using two identifiers.  Location: Patient: car Provider: office   I discussed the limitations of evaluation and management by telemedicine and the availability of in person appointments. The patient expressed understanding and agreed to proceed.  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 Irritable  Arguing more with wife  Counseling at least monthly -is somewhat helpful    Some at home-but not as much   Used to be on lexapro 20 and cut back    Patient Active Problem List   Diagnosis Date Noted  . Depressed mood 04/04/2019  . Shoulder pain 02/22/2019  . Prostate cancer screening 09/10/2015  . Rhinitis, nonallergic 04/16/2015  . Obstructive sleep apnea 05/25/2013  . Routine general medical examination at a health care facility 05/16/2013  . Back pain 02/29/2008  . Depression with anxiety 03/31/2007  . HYPOSPADIAS 03/31/2007   Past Medical History:  Diagnosis Date  . Depression    Past Surgical History:  Procedure Laterality Date  . ANTERIOR CRUCIATE LIGAMENT REPAIR  1992   Right  . HERNIA REPAIR     x 2 as a child   Social History   Tobacco Use  .  Smoking status: Never Smoker  . Smokeless tobacco: Never Used  Substance Use Topics  . Alcohol use: Yes    Alcohol/week: 0.0 standard drinks    Comment: 1-4 drinks daily (beer)  . Drug use: No   Family History  Problem Relation Age of Onset  . Depression Mother   . Diabetes type II Mother   . Emphysema Maternal Grandfather   . Prostate cancer Maternal Grandfather   . Asthma Sister        developed as a child  . Cancer Paternal Grandmother   . Lung cancer Sister        small cell    No Known Allergies Current Outpatient Medications on File Prior to Visit  Medication Sig Dispense Refill  . Ascorbic Acid (VITAMIN C) 1000 MG tablet Take 1,000 mg by mouth daily.    Marland Kitchen buPROPion (WELLBUTRIN XL) 300 MG 24 hr tablet Take 1 tablet (300 mg total) by mouth daily. 90 tablet 3  . ibuprofen (ADVIL) 200 MG tablet Take 400-800 mg by mouth every 6 (six) hours as needed (pain).    . methocarbamol (ROBAXIN) 500 MG tablet Take 1 tablet (500 mg total) by mouth at bedtime as needed for muscle spasms. 30 tablet 1  . Multiple Vitamin (MULTIVITAMIN) capsule Take 1 capsule by mouth daily.     Marland Kitchen oxymetazoline (AFRIN) 0.05 % nasal spray  Place 1 spray into both nostrils at bedtime. Alternate nostril    . valACYclovir (VALTREX) 1000 MG tablet TAKE 2 TABLETS BY MOUTH TWO TIMES A DAY FOR 1 DAY FOR COLD SORE AS NEEDED. 4 tablet 5   No current facility-administered medications on file prior to visit.   Review of Systems  Constitutional: Negative for chills, fever and malaise/fatigue.  HENT: Negative for congestion, ear pain, sinus pain and sore throat.   Eyes: Negative for blurred vision, discharge and redness.  Respiratory: Negative for cough, shortness of breath and stridor.   Cardiovascular: Negative for chest pain, palpitations and leg swelling.  Gastrointestinal: Negative for abdominal pain, diarrhea, nausea and vomiting.  Musculoskeletal: Negative for myalgias.  Skin: Negative for rash.  Neurological:  Negative for dizziness and headaches.  Psychiatric/Behavioral: Negative for depression, memory loss and substance abuse. The patient is nervous/anxious.        Depression is well controlled but anxiety is bad     Observations/Objective: Patient appears well, in no distress (does seem fatigued) Weight is baseline  No facial swelling or asymmetry Normal voice-not hoarse and no slurred speech No obvious tremor or mobility impairment Moving neck and UEs normally Able to hear the call well  No cough or shortness of breath during interview  Talkative and mentally sharp with no cognitive changes No skin changes on face or neck , no rash or pallor Affect is mildly anxious but pleasant   Assessment and Plan: Problem List Items Addressed This Visit      Other   Depression with anxiety    While depression is well controlled, anxiety is high  Exacerbated by much work stress, increased work load and pandemic fears  Reviewed stressors/ coping techniques/symptoms/ support sources/ tx options and side effects in detail today  Will plan to increase lexapro from 10 to 20 mg Also continue counseling  In the future-if no imp would consider addn of buspar or change of ssri (possible snri) inst to update [place] in several weeks or earlier if symptoms worsen      Relevant Medications   escitalopram (LEXAPRO) 20 MG tablet       Follow Up Instructions: Go ahead and increase lexapro to 20 mg once daily  No change in other medicines  Work on adding exercise more often and limit alcohol  Practice good self care  See if you can get some help at work to lighten the load  Please update me in a few weeks or earlier if needed    I discussed the assessment and treatment plan with the patient. The patient was provided an opportunity to ask questions and all were answered. The patient agreed with the plan and demonstrated an understanding of the instructions.   The patient was advised to call back or seek  an in-person evaluation if the symptoms worsen or if the condition fails to improve as anticipated.     Loura Pardon, MD

## 2019-10-20 NOTE — Assessment & Plan Note (Signed)
While depression is well controlled, anxiety is high  Exacerbated by much work stress, increased work load and pandemic fears  Reviewed stressors/ coping techniques/symptoms/ support sources/ tx options and side effects in detail today  Will plan to increase lexapro from 10 to 20 mg Also continue counseling  In the future-if no imp would consider addn of buspar or change of ssri (possible snri) inst to update [place] in several weeks or earlier if symptoms worsen

## 2019-11-01 DIAGNOSIS — F4323 Adjustment disorder with mixed anxiety and depressed mood: Secondary | ICD-10-CM | POA: Diagnosis not present

## 2019-11-02 MED FILL — buPROPion HCL ER (XL) 300 M: 300 | 30 days supply | Qty: 30 | Fill #9

## 2019-11-04 MED FILL — ESCITALOPRAM 20 MG TABLET: 20 | 90 days supply | Qty: 90 | Fill #0

## 2019-11-08 DIAGNOSIS — F4323 Adjustment disorder with mixed anxiety and depressed mood: Secondary | ICD-10-CM | POA: Diagnosis not present

## 2019-12-06 DIAGNOSIS — F4323 Adjustment disorder with mixed anxiety and depressed mood: Secondary | ICD-10-CM | POA: Diagnosis not present

## 2019-12-06 MED FILL — buPROPion HCL ER (XL) 300 M: 300 | 30 days supply | Qty: 30 | Fill #10

## 2019-12-07 DIAGNOSIS — G4733 Obstructive sleep apnea (adult) (pediatric): Secondary | ICD-10-CM | POA: Diagnosis not present

## 2019-12-22 DIAGNOSIS — H5213 Myopia, bilateral: Secondary | ICD-10-CM | POA: Diagnosis not present

## 2020-01-04 MED FILL — buPROPion HCL ER (XL) 300 M: 300 | 30 days supply | Qty: 30 | Fill #11

## 2020-01-09 ENCOUNTER — Telehealth: Payer: Self-pay | Admitting: Family Medicine

## 2020-01-09 DIAGNOSIS — Z125 Encounter for screening for malignant neoplasm of prostate: Secondary | ICD-10-CM

## 2020-01-09 DIAGNOSIS — Z0000 Encounter for general adult medical examination without abnormal findings: Secondary | ICD-10-CM

## 2020-01-09 NOTE — Telephone Encounter (Signed)
-----   Message from Ellamae Sia sent at 12/29/2019  3:21 PM EDT ----- Regarding: lab orders for Tuesday, 11.16.21 Patient is scheduled for CPX labs, please order future labs, Thanks , Karna Christmas

## 2020-01-10 ENCOUNTER — Other Ambulatory Visit: Payer: Self-pay

## 2020-01-10 ENCOUNTER — Other Ambulatory Visit (INDEPENDENT_AMBULATORY_CARE_PROVIDER_SITE_OTHER): Payer: 59

## 2020-01-10 DIAGNOSIS — Z125 Encounter for screening for malignant neoplasm of prostate: Secondary | ICD-10-CM

## 2020-01-10 DIAGNOSIS — Z0000 Encounter for general adult medical examination without abnormal findings: Secondary | ICD-10-CM

## 2020-01-10 LAB — COMPREHENSIVE METABOLIC PANEL
ALT: 15 U/L (ref 0–53)
AST: 19 U/L (ref 0–37)
Albumin: 4.2 g/dL (ref 3.5–5.2)
Alkaline Phosphatase: 47 U/L (ref 39–117)
BUN: 10 mg/dL (ref 6–23)
CO2: 31 mEq/L (ref 19–32)
Calcium: 9.1 mg/dL (ref 8.4–10.5)
Chloride: 100 mEq/L (ref 96–112)
Creatinine, Ser: 0.94 mg/dL (ref 0.40–1.50)
GFR: 94.47 mL/min (ref >60.00)
Glucose, Bld: 90 mg/dL (ref 70–99)
Potassium: 4.5 mEq/L (ref 3.5–5.1)
Sodium: 137 mEq/L (ref 135–145)
Total Bilirubin: 0.5 mg/dL (ref 0.2–1.2)
Total Protein: 6.3 g/dL (ref 6.0–8.3)

## 2020-01-10 LAB — CBC WITH DIFFERENTIAL/PLATELET
Basophils Absolute: 0 10*3/uL (ref 0.0–0.1)
Basophils Relative: 0.6 % (ref 0.0–3.0)
Eosinophils Absolute: 0.2 10*3/uL (ref 0.0–0.7)
Eosinophils Relative: 3.2 % (ref 0.0–5.0)
HCT: 43.6 % (ref 39.0–52.0)
Hemoglobin: 14.5 g/dL (ref 13.0–17.0)
Lymphocytes Relative: 32.7 % (ref 12.0–46.0)
Lymphs Abs: 1.6 10*3/uL (ref 0.7–4.0)
MCHC: 33.3 g/dL (ref 30.0–36.0)
MCV: 92.9 fl (ref 78.0–100.0)
Monocytes Absolute: 0.5 10*3/uL (ref 0.1–1.0)
Monocytes Relative: 10.3 % (ref 3.0–12.0)
Neutro Abs: 2.5 10*3/uL (ref 1.4–7.7)
Neutrophils Relative %: 53.2 % (ref 43.0–77.0)
Platelets: 205 10*3/uL (ref 150.0–400.0)
RBC: 4.7 Mil/uL (ref 4.22–5.81)
RDW: 13.3 % (ref 11.5–15.5)
WBC: 4.8 10*3/uL (ref 4.0–10.5)

## 2020-01-10 LAB — TSH: TSH: 2.02 u[IU]/mL (ref 0.35–4.50)

## 2020-01-10 LAB — LIPID PANEL
Cholesterol: 177 mg/dL (ref 0–200)
HDL: 65.4 mg/dL (ref >39.00)
LDL Cholesterol: 101 mg/dL — ABNORMAL HIGH (ref 0–99)
NonHDL: 111.7
Total CHOL/HDL Ratio: 3
Triglycerides: 53 mg/dL (ref 0.0–149.0)
VLDL: 10.6 mg/dL (ref 0.0–40.0)

## 2020-01-10 LAB — PSA: PSA: 2.21 ng/mL (ref 0.10–4.00)

## 2020-01-16 ENCOUNTER — Other Ambulatory Visit: Payer: Self-pay | Admitting: Family Medicine

## 2020-01-16 ENCOUNTER — Other Ambulatory Visit: Payer: Self-pay

## 2020-01-16 ENCOUNTER — Ambulatory Visit (INDEPENDENT_AMBULATORY_CARE_PROVIDER_SITE_OTHER): Payer: 59 | Admitting: Family Medicine

## 2020-01-16 ENCOUNTER — Encounter: Payer: Self-pay | Admitting: Family Medicine

## 2020-01-16 VITALS — BP 130/80 | HR 74 | Temp 97.8°F | Ht 71.25 in | Wt 212.5 lb

## 2020-01-16 DIAGNOSIS — Z1211 Encounter for screening for malignant neoplasm of colon: Secondary | ICD-10-CM | POA: Insufficient documentation

## 2020-01-16 DIAGNOSIS — G4733 Obstructive sleep apnea (adult) (pediatric): Secondary | ICD-10-CM | POA: Diagnosis not present

## 2020-01-16 DIAGNOSIS — F418 Other specified anxiety disorders: Secondary | ICD-10-CM | POA: Diagnosis not present

## 2020-01-16 DIAGNOSIS — Z0000 Encounter for general adult medical examination without abnormal findings: Secondary | ICD-10-CM | POA: Diagnosis not present

## 2020-01-16 DIAGNOSIS — Z125 Encounter for screening for malignant neoplasm of prostate: Secondary | ICD-10-CM

## 2020-01-16 MED ORDER — ESCITALOPRAM OXALATE 20 MG PO TABS
20.0000 mg | ORAL_TABLET | Freq: Every day | ORAL | 3 refills | Status: DC
Start: 2020-01-16 — End: 2020-09-12

## 2020-01-16 MED ORDER — BUPROPION HCL ER (XL) 300 MG PO TB24
300.0000 mg | ORAL_TABLET | Freq: Every day | ORAL | 3 refills | Status: DC
Start: 2020-01-16 — End: 2020-01-16

## 2020-01-16 NOTE — Assessment & Plan Note (Signed)
Continues cpap.  

## 2020-01-16 NOTE — Assessment & Plan Note (Signed)
Reviewed health habits including diet and exercise and skin cancer prevention Reviewed appropriate screening tests for age  Also reviewed health mt list, fam hx and immunization status , as well as social and family history   See HPI Labs reviewed  Enc pt to add more exercise utd covid imms  Discussed shingrix vaccine-is interested  Screening colonoscopy ref made  Rev psa

## 2020-01-16 NOTE — Patient Instructions (Addendum)
If you are interested in the shingles vaccine series (Shingrix), call your insurance or pharmacy to check on coverage and location it must be given.  If affordable - you can schedule it here or at your pharmacy depending on coverage    Get back to your exercises   The office will call you to set up a colonoscopy   Take care of yourself

## 2020-01-16 NOTE — Assessment & Plan Note (Signed)
Referred for first screening colonoscopy  

## 2020-01-16 NOTE — Assessment & Plan Note (Signed)
Improved mental health status Continues wellbutrin and lexapro Encouraged him to continue counseling  Also stress level is a bit lighter

## 2020-01-16 NOTE — Assessment & Plan Note (Signed)
Lab Results  Component Value Date   PSA 2.21 01/10/2020   PSA 1.29 01/12/2019   PSA 1.11 12/28/2017    No change in voiding habits  Will continue to watch

## 2020-01-16 NOTE — Progress Notes (Signed)
Subjective:    Patient ID: Eric Blackburn, male    DOB: Jul 07, 1970, 49 y.o.   MRN: [id number]  This visit occurred during the SARS-CoV-2 public health emergency.  Safety protocols were in place, including screening questions prior to the visit, additional usage of staff PPE, and extensive cleaning of exam room while observing appropriate contact time as indicated for disinfecting solutions.    HPI Here for health maintenance exam and to review chronic medical problems    Wt Readings from Last 3 Encounters:  01/16/20 212 lb 8 oz (96.4 kg)  10/20/19 215 lb (97.5 kg)  04/22/19 220 lb (99.8 kg)   29.43 kg/m  Working a lot- same thing as usual  Higher level of responsibility at work- stressful and good at the same time  Tries to get 10,000 steps per day  Was doing some exercises - at home /wants to add it back in  Careful with shoulders  Has some exercise bands  Has developed some elbow issues from use of mouse- has a forearm band and it helped    covid status- pfizer imm (was also in a study)  Booster was 12/09/19  Flu shot -has had in oct  Tdap 4/15 Zoster status -is interested in shingrix  Colon cancer screening - wants to schedule a colonoscopy   Prostate health  Lab Results  Component Value Date   PSA 2.21 01/10/2020   PSA 1.29 01/12/2019   PSA 1.11 12/28/2017  prostate cancer in [facility]  He gets up at night when he drinks a lot of liquids    BP Readings from Last 3 Encounters:  01/16/20 (!) 138/92  04/22/19 112/80  01/13/19 106/64   Pulse Readings from Last 3 Encounters:  01/16/20 74  04/22/19 62  01/13/19 61      h/o depression with anxiety  Takes wellbutrin xl 300 mg daily  lexapro 20 mg daily-doing better on this does Sees counseling once per month  Stress level is a little better lately   OSA- uses cpap  Cholesterol  Lab Results  Component Value Date   CHOL 177 01/10/2020   CHOL 186 01/12/2019   CHOL 176 12/28/2017   Lab Results    Component Value Date   HDL 65.40 01/10/2020   HDL 63.10 01/12/2019   HDL 59.30 12/28/2017   Lab Results  Component Value Date   LDLCALC 101 (H) 01/10/2020   LDLCALC 106 (H) 01/12/2019   LDLCALC 101 (H) 12/28/2017   Lab Results  Component Value Date   TRIG 53.0 01/10/2020   TRIG 85.0 01/12/2019   TRIG 80.0 12/28/2017   Lab Results  Component Value Date   CHOLHDL 3 01/10/2020   CHOLHDL 3 01/12/2019   CHOLHDL 3 12/28/2017   No results found for: LDLDIRECT Diet is fair  Eats a lot of sweets    Other labs  Lab Results  Component Value Date   CREATININE 0.94 01/10/2020   BUN 10 01/10/2020   NA 137 01/10/2020   K 4.5 01/10/2020   CL 100 01/10/2020   CO2 31 01/10/2020   Lab Results  Component Value Date   ALT 15 01/10/2020   AST 19 01/10/2020   ALKPHOS 47 01/10/2020   BILITOT 0.5 01/10/2020   Lab Results  Component Value Date   WBC 4.8 01/10/2020   HGB 14.5 01/10/2020   HCT 43.6 01/10/2020   MCV 92.9 01/10/2020   PLT 205.0 01/10/2020   Lab Results  Component Value Date   TSH  2.02 01/10/2020    Patient Active Problem List   Diagnosis Date Noted  . Colon cancer screening 01/16/2020  . Depressed mood 04/04/2019  . Shoulder pain 02/22/2019  . Prostate cancer screening 09/10/2015  . Rhinitis, nonallergic 04/16/2015  . Obstructive sleep apnea 05/25/2013  . Routine general medical examination at a health care facility 05/16/2013  . Back pain 02/29/2008  . Depression with anxiety 03/31/2007  . HYPOSPADIAS 03/31/2007   Past Medical History:  Diagnosis Date  . Depression    Past Surgical History:  Procedure Laterality Date  . ANTERIOR CRUCIATE LIGAMENT REPAIR  1992   Right  . HERNIA REPAIR     x 2 as a child   Social History   Tobacco Use  . Smoking status: Never Smoker  . Smokeless tobacco: Never Used  Substance Use Topics  . Alcohol use: Yes    Alcohol/week: 0.0 standard drinks    Comment: 1-4 drinks daily (beer)  . Drug use: No   Family  History  Problem Relation Age of Onset  . Depression Mother   . Diabetes type II Mother   . Emphysema Maternal Grandfather   . Prostate cancer Maternal Grandfather   . Asthma Sister        developed as a child  . Cancer Paternal Grandmother   . Lung cancer Sister        small cell    No Known Allergies Current Outpatient Medications on File Prior to Visit  Medication Sig Dispense Refill  . ibuprofen (ADVIL) 200 MG tablet Take 400-800 mg by mouth every 6 (six) hours as needed (pain).    . Multiple Vitamin (MULTIVITAMIN) capsule Take 1 capsule by mouth daily.     . Omega-3 Fatty Acids (FISH OIL) 1000 MG CAPS Take 1 capsule by mouth daily.    Marland Kitchen oxymetazoline (AFRIN) 0.05 % nasal spray Place 1 spray into both nostrils at bedtime. Alternate nostril    . valACYclovir (VALTREX) 1000 MG tablet TAKE 2 TABLETS BY MOUTH TWO TIMES A DAY FOR 1 DAY FOR COLD SORE AS NEEDED. 4 tablet 5   No current facility-administered medications on file prior to visit.    Review of Systems  Constitutional: Negative for activity change, appetite change, fatigue, fever and unexpected weight change.  HENT: Negative for congestion, rhinorrhea, sore throat and trouble swallowing.   Eyes: Negative for pain, redness, itching and visual disturbance.  Respiratory: Negative for cough, chest tightness, shortness of breath and wheezing.   Cardiovascular: Negative for chest pain and palpitations.  Gastrointestinal: Negative for abdominal pain, blood in stool, constipation, diarrhea and nausea.  Endocrine: Negative for cold intolerance, heat intolerance, polydipsia and polyuria.  Genitourinary: Negative for difficulty urinating, dysuria, frequency and urgency.  Musculoskeletal: Negative for arthralgias, joint swelling and myalgias.       Chronic shoulder pain  Skin: Negative for pallor and rash.  Neurological: Negative for dizziness, tremors, weakness, numbness and headaches.  Hematological: Negative for adenopathy. Does  not bruise/bleed easily.  Psychiatric/Behavioral: Negative for decreased concentration and dysphoric mood. The patient is not nervous/anxious.        Objective:   Physical Exam Constitutional:      General: He is not in acute distress.    Appearance: Normal appearance. He is well-developed and normal weight. He is not ill-appearing or diaphoretic.  HENT:     Head: Normocephalic and atraumatic.     Right Ear: Tympanic membrane, ear canal and external ear normal.     Left Ear: Tympanic  membrane, ear canal and external ear normal.     Nose: Nose normal. No congestion.     Mouth/Throat:     Mouth: Mucous membranes are moist.     Pharynx: Oropharynx is clear. No posterior oropharyngeal erythema.  Eyes:     General: No scleral icterus.       Right eye: No discharge.        Left eye: No discharge.     Conjunctiva/sclera: Conjunctivae normal.     Pupils: Pupils are equal, round, and reactive to light.  Neck:     Thyroid: No thyromegaly.     Vascular: No carotid bruit or JVD.  Cardiovascular:     Rate and Rhythm: Normal rate and regular rhythm.     Pulses: Normal pulses.     Heart sounds: Normal heart sounds. No gallop.   Pulmonary:     Effort: Pulmonary effort is normal. No respiratory distress.     Breath sounds: Normal breath sounds. No wheezing or rales.     Comments: Good air exch Chest:     Chest wall: No tenderness.  Abdominal:     General: Bowel sounds are normal. There is no distension or abdominal bruit.     Palpations: Abdomen is soft. There is no mass.     Tenderness: There is no abdominal tenderness.     Hernia: No hernia is present.  Musculoskeletal:        General: No tenderness.     Cervical back: Normal range of motion and neck supple. No rigidity. No muscular tenderness.     Right lower leg: No edema.     Left lower leg: No edema.     Comments: No acute joint changes   Lymphadenopathy:     Cervical: No cervical adenopathy.  Skin:    General: Skin is warm  and dry.     Coloration: Skin is not pale.     Findings: No erythema or rash.     Comments: Solar lentigines diffusely   Neurological:     Mental Status: He is alert.     Cranial Nerves: No cranial nerve deficit.     Motor: No abnormal muscle tone.     Coordination: Coordination normal.     Gait: Gait normal.     Deep Tendon Reflexes: Reflexes are normal and symmetric. Reflexes normal.  Psychiatric:        Mood and Affect: Mood normal.        Cognition and Memory: Cognition and memory normal.           Assessment & Plan:   Problem List Items Addressed This Visit      Respiratory   Obstructive sleep apnea    Continues cpap        Other   Depression with anxiety    Improved mental health status Continues wellbutrin and lexapro Encouraged him to continue counseling  Also stress level is a bit lighter       Relevant Medications   buPROPion (WELLBUTRIN XL) 300 MG 24 hr tablet   escitalopram (LEXAPRO) 20 MG tablet   Routine general medical examination at a health care facility - Primary    Reviewed health habits including diet and exercise and skin cancer prevention Reviewed appropriate screening tests for age  Also reviewed health mt list, fam hx and immunization status , as well as social and family history   See HPI Labs reviewed  Enc pt to add more exercise utd covid imms  Discussed  shingrix vaccine-is interested  Screening colonoscopy ref made  Rev psa        Prostate cancer screening    Lab Results  Component Value Date   PSA 2.21 01/10/2020   PSA 1.29 01/12/2019   PSA 1.11 12/28/2017    No change in voiding habits  Will continue to watch      Colon cancer screening    Referred for first screening colonoscopy       Relevant Orders   Ambulatory referral to Gastroenterology

## 2020-01-18 DIAGNOSIS — F4323 Adjustment disorder with mixed anxiety and depressed mood: Secondary | ICD-10-CM | POA: Diagnosis not present

## 2020-02-06 MED FILL — ESCITALOPRAM 20 MG TABLET: 20 | 90 days supply | Qty: 90 | Fill #1

## 2020-02-06 MED FILL — buPROPion HCL ER (XL) 300 M: 300 | 90 days supply | Qty: 90 | Fill #0

## 2020-02-25 HISTORY — PX: COLONOSCOPY: SHX174

## 2020-02-28 DIAGNOSIS — F4323 Adjustment disorder with mixed anxiety and depressed mood: Secondary | ICD-10-CM | POA: Diagnosis not present

## 2020-03-05 DIAGNOSIS — G4733 Obstructive sleep apnea (adult) (pediatric): Secondary | ICD-10-CM | POA: Diagnosis not present

## 2020-03-21 ENCOUNTER — Ambulatory Visit (AMBULATORY_SURGERY_CENTER): Payer: Self-pay

## 2020-03-21 ENCOUNTER — Encounter: Payer: Self-pay | Admitting: Internal Medicine

## 2020-03-21 ENCOUNTER — Other Ambulatory Visit: Payer: Self-pay

## 2020-03-21 ENCOUNTER — Other Ambulatory Visit: Payer: Self-pay | Admitting: Internal Medicine

## 2020-03-21 VITALS — Ht 71.25 in | Wt 217.0 lb

## 2020-03-21 DIAGNOSIS — Z1211 Encounter for screening for malignant neoplasm of colon: Secondary | ICD-10-CM

## 2020-03-21 MED ORDER — NA SULFATE-K SULFATE-MG SULF 17.5-3.13-1.6 GM/177ML PO SOLN: 1.0000 | Freq: Once | ORAL | 0 refills | Status: DC

## 2020-03-21 MED FILL — SUPREP BOWEL PREP KIT: 17.5-3.13-1 | 1 days supply | Qty: 354 | Fill #0

## 2020-03-21 NOTE — Progress Notes (Signed)
No egg or soy allergy known to patient  No issues with past sedation with any surgeries or procedures No intubation problems in the past  No FH of Malignant Hyperthermia No diet pills per patient No home 02 use per patient  No blood thinners per patient  Pt denies issues with constipation  No A fib or A flutter  EMMI video via [place] 19 guidelines implemented in [facility] today with Pt and RN  Pt is fully vaccinated  for Covid x2 + booster= Coupon given to pt in [facility] today , Code to Pharmacy  Due to the COVID-19 pandemic we are asking patients to follow certain guidelines.  Pt aware of COVID protocols and [facility] guidelines

## 2020-03-28 DIAGNOSIS — F4323 Adjustment disorder with mixed anxiety and depressed mood: Secondary | ICD-10-CM | POA: Diagnosis not present

## 2020-04-04 ENCOUNTER — Encounter: Payer: 59 | Admitting: Internal Medicine

## 2020-04-10 ENCOUNTER — Encounter: Payer: Self-pay | Admitting: Internal Medicine

## 2020-04-10 ENCOUNTER — Ambulatory Visit (AMBULATORY_SURGERY_CENTER): Payer: 59 | Admitting: Internal Medicine

## 2020-04-10 ENCOUNTER — Other Ambulatory Visit: Payer: Self-pay

## 2020-04-10 VITALS — BP 119/76 | HR 53 | Temp 96.6°F | Resp 21 | Ht 71.25 in | Wt 217.0 lb

## 2020-04-10 DIAGNOSIS — D120 Benign neoplasm of cecum: Secondary | ICD-10-CM

## 2020-04-10 DIAGNOSIS — Z1211 Encounter for screening for malignant neoplasm of colon: Secondary | ICD-10-CM | POA: Diagnosis not present

## 2020-04-10 DIAGNOSIS — Z860100 Personal history of colon polyps, unspecified: Secondary | ICD-10-CM

## 2020-04-10 DIAGNOSIS — D123 Benign neoplasm of transverse colon: Secondary | ICD-10-CM

## 2020-04-10 DIAGNOSIS — K635 Polyp of colon: Secondary | ICD-10-CM

## 2020-04-10 HISTORY — DX: Personal history of colon polyps, unspecified: Z86.0100

## 2020-04-10 HISTORY — DX: Personal history of colonic polyps: Z86.010

## 2020-04-10 MED ORDER — SODIUM CHLORIDE 0.9 % IV SOLN: 500.0000 mL | Freq: Once | INTRAVENOUS | Status: DC

## 2020-04-10 NOTE — Progress Notes (Signed)
PT taken to PACU. Monitors in place. VSS. Report given to RN. 

## 2020-04-10 NOTE — Progress Notes (Signed)
Pt's states no medical or surgical changes since previsit or office visit. 

## 2020-04-10 NOTE — Patient Instructions (Signed)
Read all of the handouts given to you by your recovery room nurse.  YOU HAD AN ENDOSCOPIC PROCEDURE TODAY AT THE  ENDOSCOPY CENTER:   Refer to the procedure report that was given to you for any specific questions about what was found during the examination.  If the procedure report does not answer your questions, please call your gastroenterologist to clarify.  If you requested that your care partner not be given the details of your procedure findings, then the procedure report has been included in a sealed envelope for you to review at your convenience later.  YOU SHOULD EXPECT: Some feelings of bloating in the abdomen. Passage of more gas than usual.  Walking can help get rid of the air that was put into your GI tract during the procedure and reduce the bloating. If you had a lower endoscopy (such as a colonoscopy or flexible sigmoidoscopy) you may notice spotting of blood in your stool or on the toilet paper. If you underwent a bowel prep for your procedure, you may not have a normal bowel movement for a few days.  Please Note:  You might notice some irritation and congestion in your nose or some drainage.  This is from the oxygen used during your procedure.  There is no need for concern and it should clear up in a day or so.  SYMPTOMS TO REPORT IMMEDIATELY:  Following lower endoscopy (colonoscopy or flexible sigmoidoscopy):  Excessive amounts of blood in the stool  Significant tenderness or worsening of abdominal pains  Swelling of the abdomen that is new, acute  Fever of 100F or higher   Black, tarry-looking stools  For urgent or emergent issues, a gastroenterologist can be reached at any hour by calling [phone]. Do not use MyChart messaging for urgent concerns.    DIET:  We do recommend a small meal at first, but then you may proceed to your regular diet.  Drink plenty of fluids but you should avoid alcoholic beverages for 24 hours.  ACTIVITY:  You should plan to take it  easy for the rest of today and you should NOT DRIVE or use heavy machinery until tomorrow (because of the sedation medicines used during the test).    FOLLOW UP: Our staff will call the number listed on your records 48-72 hours following your procedure to check on you and address any questions or concerns that you may have regarding the information given to you following your procedure. If we do not reach you, we will leave a message.  We will attempt to reach you two times.  During this call, we will ask if you have developed any symptoms of COVID 19. If you develop any symptoms (ie: fever, flu-like symptoms, shortness of breath, cough etc.) before then, please call [phone].  If you test positive for Covid 19 in the 2 weeks post procedure, please call and report this information to us.    If any biopsies were taken you will be contacted by phone or by letter within the next 1-3 weeks.  Please call us at [phone] if you have not heard about the biopsies in 3 weeks.    SIGNATURES/CONFIDENTIALITY: You and/or your care partner have signed paperwork which will be entered into your electronic medical record.  These signatures attest to the fact that that the information above on your After Visit Summary has been reviewed and is understood.  Full responsibility of the confidentiality of this discharge information lies with you and/or your care-partner.  

## 2020-04-10 NOTE — Progress Notes (Signed)
Called to room to assist during endoscopic procedure.  Patient ID and intended procedure confirmed with present staff. Received instructions for my participation in the procedure from the performing physician.  

## 2020-04-10 NOTE — Op Note (Signed)
[place] Patient Name: Eric Blackburn Procedure Date: 04/10/2020 9:39 AM MRN: [id number] Endoscopist: Gatha Mayer , MD Age: 50 Referring MD:  Date of Birth: 11/23/1970 Gender: Male Account #: [id number] Procedure:                Colonoscopy Indications:              Screening for colorectal malignant neoplasm, This                            is the patient's first colonoscopy Medicines:                Propofol per Anesthesia, Monitored Anesthesia Care Procedure:                Pre-Anesthesia Assessment:                           - Prior to the procedure, a History and Physical                            was performed, and patient medications and                            allergies were reviewed. The patient's tolerance of                            previous anesthesia was also reviewed. The risks                            and benefits of the procedure and the sedation                            options and risks were discussed with the patient.                            All questions were answered, and informed consent                            was obtained. Prior Anticoagulants: The patient has                            taken no previous anticoagulant or antiplatelet                            agents. ASA Grade Assessment: II - A patient with                            mild systemic disease. After reviewing the risks                            and benefits, the patient was deemed in                            satisfactory condition to undergo the procedure.  After obtaining informed consent, the colonoscope                            was passed under direct vision. Throughout the                            procedure, the patient's blood pressure, pulse, and                            oxygen saturations were monitored continuously. The                            Olympus CF-HQ190 [id number]) Colonoscope was                             introduced through the anus and advanced to the the                            cecum, identified by appendiceal orifice and                            ileocecal valve. The colonoscopy was performed                            without difficulty. The patient tolerated the                            procedure well. The quality of the bowel                            preparation was good. The ileocecal valve,                            appendiceal orifice, and rectum were photographed.                            The bowel preparation used was SUPREP via split                            dose instruction. Scope In: 9:54:41 AM Scope Out: 10:14:33 AM Scope Withdrawal Time: 0 hours 13 minutes 21 seconds  Total Procedure Duration: 0 hours 19 minutes 52 seconds  Findings:                 The perianal and digital rectal examinations were                            normal. Pertinent negatives include normal prostate                            (size, shape, and consistency).                           Three sessile polyps were found in the transverse  colon and cecum. The polyps were diminutive in                            size. These polyps were removed with a cold snare.                            Resection and retrieval were complete. Verification                            of patient identification for the specimen was                            done. Estimated blood loss was minimal.                           The exam was otherwise without abnormality on                            direct and retroflexion views. Complications:            No immediate complications. Estimated Blood Loss:     Estimated blood loss was minimal. Impression:               - Three diminutive polyps in the transverse colon                            and in the cecum, removed with a cold snare.                            Resected and retrieved.                           - The examination was  otherwise normal on direct                            and retroflexion views. Recommendation:           - Patient has a contact number available for                            emergencies. The signs and symptoms of potential                            delayed complications were discussed with the                            patient. Return to normal activities tomorrow.                            Written discharge instructions were provided to the                            patient.                           - Resume previous diet.                           -  Continue present medications.                           - Repeat colonoscopy is recommended. The                            colonoscopy date will be determined after pathology                            results from today's exam become available for                            review. Gatha Mayer, MD 04/10/2020 10:24:29 AM This report has been signed electronically.

## 2020-04-12 ENCOUNTER — Telehealth: Payer: Self-pay | Admitting: *Deleted

## 2020-04-12 ENCOUNTER — Telehealth: Payer: Self-pay

## 2020-04-12 NOTE — Telephone Encounter (Signed)
Left message on follow up call. 

## 2020-04-12 NOTE — Telephone Encounter (Signed)
  Follow up Call-  Call back number 04/10/2020  Post procedure Call Back phone  # [phone]  Permission to leave phone message Yes  Some recent data might be hidden     Patient questions:  Do you have a fever, pain , or abdominal swelling? No. Pain Score  0 *  Have you tolerated food without any problems? Yes.    Have you been able to return to your normal activities? Yes.    Do you have any questions about your discharge instructions: Diet   No. Medications  No. Follow up visit  No.  Do you have questions or concerns about your Care? No.  Actions: * If pain score is 4 or above: No action needed, pain <4.  1. Have you developed a fever since your procedure? no  2.   Have you had an respiratory symptoms (SOB or cough) since your procedure? no  3.   Have you tested positive for COVID 19 since your procedure no  4.   Have you had any family members/close contacts diagnosed with the COVID 19 since your procedure?  no   If yes to any of these questions please route to Joylene John, RN and Joella Prince, RN

## 2020-04-16 DIAGNOSIS — F4323 Adjustment disorder with mixed anxiety and depressed mood: Secondary | ICD-10-CM | POA: Diagnosis not present

## 2020-04-17 MED FILL — ESCITALOPRAM 20 MG TABLET: 20 | 90 days supply | Qty: 90 | Fill #2

## 2020-04-18 MED FILL — buPROPion HCL ER (XL) 300 M: 300 | 90 days supply | Qty: 90 | Fill #1

## 2020-04-24 ENCOUNTER — Ambulatory Visit: Payer: 59 | Admitting: Internal Medicine

## 2020-04-26 ENCOUNTER — Encounter: Payer: Self-pay | Admitting: Internal Medicine

## 2020-05-02 DIAGNOSIS — F4323 Adjustment disorder with mixed anxiety and depressed mood: Secondary | ICD-10-CM | POA: Diagnosis not present

## 2020-05-18 NOTE — Progress Notes (Signed)
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 His wife tells him she is hearing noise which may be breakthrough snoring. We discussed changing to autopap, since his replacement machine, new to him in January, is an Autoset 10. Had Covid vax Phizer x 2.  05/21/20- 50 year old male never smoker, [employer], followed for OSA, nasal congestion, complicated by low back pain, depression, rhinitis,  CPAP auto 5-15/Adapt Download- compliance 74%, AHI 0.4/ hr Body weight today-218 lbs Covid vax- 3 Phizer Flu vax-had Prefers nasal mask with chin strap. Considering travel machine.  Discussed Inspire- not interested.  Occasional melatonin helps insomnia.  ROS-see HPI  + = positive Constitutional:   No-   weight loss, night sweats, fevers, chills, fatigue, lassitude. HEENT:   No-  headaches, difficulty swallowing, tooth/dental problems, sore throat,       No-  sneezing, itching, ear ache, + nasal congestion, post nasal drip,  CV:  No-   chest pain, orthopnea, PND, swelling in lower extremities, anasarca,                                                    dizziness, palpitations Resp: No-   shortness of breath with exertion or at rest.              No-   productive cough,  No non-productive cough,  No- coughing up of blood.              No-   change in color of mucus.  No- wheezing.   Skin: No-   rash or lesions. GI:  No-   heartburn, indigestion, abdominal pain, nausea, vomiting,   GU:  MS:  No-   joint pain or swelling.   Neuro-     nothing unusual Psych:  No- change in mood or affect. + depression or anxiety.  No memory loss.  OBJ- Physical Exam   General- Alert, Oriented, Affect-appropriate, Distress- none acute, medium build Skin- rash-none, lesions- none, excoriation- none Lymphadenopathy- none Head- atraumatic            Eyes- Gross vision intact, PERRLA, conjunctivae and secretions clear            Ears- Hearing, canals-normal            Nose- Clear, no-Septal dev, mucus, polyps, erosion, perforation             Throat- +Small mandible, Mallampati III , mucosa clear , drainage- none, tonsils- atrophic Neck- flexible , trachea midline, no stridor , thyroid nl, carotid no bruit Chest - symmetrical excursion , unlabored           Heart/CV- RRR , no murmur , no gallop  , no rub, nl s1 s2                           - JVD- none , edema- none, stasis changes- none, varices- none  Lung- clear to P&A, wheeze- none, cough- none , dullness-none, rub- none           Chest wall-  Abd-  Br/ Gen/ Rectal- Not done, not indicated Extrem- cyanosis- none, clubbing, none, atrophy- none, strength- nl Neuro- grossly intact to observation

## 2020-05-21 ENCOUNTER — Other Ambulatory Visit: Payer: Self-pay

## 2020-05-21 ENCOUNTER — Encounter: Payer: Self-pay | Admitting: Internal Medicine

## 2020-05-21 ENCOUNTER — Ambulatory Visit (INDEPENDENT_AMBULATORY_CARE_PROVIDER_SITE_OTHER): Payer: 59 | Admitting: Internal Medicine

## 2020-05-21 DIAGNOSIS — J310 Chronic rhinitis: Secondary | ICD-10-CM | POA: Diagnosis not present

## 2020-05-21 DIAGNOSIS — G4733 Obstructive sleep apnea (adult) (pediatric): Secondary | ICD-10-CM

## 2020-05-21 MED ORDER — MELATONIN 5 MG PO TABS: 5.0000 mg | ORAL_TABLET | Freq: Every evening | ORAL | 12 refills | Status: DC | PRN

## 2020-05-21 NOTE — Assessment & Plan Note (Addendum)
Benefits with satisfactory control. Discussed compliance goals. Melatonin helpful. Plan- continue CPAP auto 5-15. Consider travel machine

## 2020-05-21 NOTE — Patient Instructions (Signed)
We can continue CPAP auto 5-15  Considering a travel machine- you can talk with your DME company, but you can also go to an on-line source like [email or web address].  Brands that people have seemed to like are Transcend and AirMini.   Please call if we can help

## 2020-05-21 NOTE — Assessment & Plan Note (Signed)
Not interfering with CPAP use 

## 2020-05-29 ENCOUNTER — Other Ambulatory Visit (HOSPITAL_COMMUNITY): Payer: Self-pay

## 2020-05-29 DIAGNOSIS — G4733 Obstructive sleep apnea (adult) (pediatric): Secondary | ICD-10-CM | POA: Diagnosis not present

## 2020-05-30 DIAGNOSIS — X32XXXA Exposure to sunlight, initial encounter: Secondary | ICD-10-CM | POA: Diagnosis not present

## 2020-05-30 DIAGNOSIS — D485 Neoplasm of uncertain behavior of skin: Secondary | ICD-10-CM | POA: Diagnosis not present

## 2020-05-30 DIAGNOSIS — L570 Actinic keratosis: Secondary | ICD-10-CM | POA: Diagnosis not present

## 2020-05-30 DIAGNOSIS — Z1283 Encounter for screening for malignant neoplasm of skin: Secondary | ICD-10-CM | POA: Diagnosis not present

## 2020-05-30 DIAGNOSIS — D225 Melanocytic nevi of trunk: Secondary | ICD-10-CM | POA: Diagnosis not present

## 2020-06-06 DIAGNOSIS — F4323 Adjustment disorder with mixed anxiety and depressed mood: Secondary | ICD-10-CM | POA: Diagnosis not present

## 2020-06-26 DIAGNOSIS — G4733 Obstructive sleep apnea (adult) (pediatric): Secondary | ICD-10-CM | POA: Diagnosis not present

## 2020-06-27 DIAGNOSIS — F4323 Adjustment disorder with mixed anxiety and depressed mood: Secondary | ICD-10-CM | POA: Diagnosis not present

## 2020-08-02 DIAGNOSIS — G4733 Obstructive sleep apnea (adult) (pediatric): Secondary | ICD-10-CM | POA: Diagnosis not present

## 2020-08-06 ENCOUNTER — Other Ambulatory Visit (HOSPITAL_COMMUNITY): Payer: Self-pay

## 2020-08-06 MED FILL — Bupropion HCl Tab ER 24HR 300 MG: ORAL | 90 days supply | Qty: 90 | Fill #0 | Status: AC

## 2020-08-06 MED FILL — Escitalopram Oxalate Tab 20 MG (Base Equiv): ORAL | 90 days supply | Qty: 90 | Fill #0 | Status: AC

## 2020-09-03 DIAGNOSIS — F4323 Adjustment disorder with mixed anxiety and depressed mood: Secondary | ICD-10-CM | POA: Diagnosis not present

## 2020-09-12 ENCOUNTER — Encounter: Payer: Self-pay | Admitting: Family Medicine

## 2020-09-12 ENCOUNTER — Other Ambulatory Visit: Payer: Self-pay

## 2020-09-12 ENCOUNTER — Other Ambulatory Visit (HOSPITAL_COMMUNITY): Payer: Self-pay

## 2020-09-12 ENCOUNTER — Ambulatory Visit: Payer: 59 | Admitting: Family Medicine

## 2020-09-12 VITALS — BP 132/78 | HR 73 | Temp 97.9°F | Ht 71.25 in | Wt 217.3 lb

## 2020-09-12 DIAGNOSIS — F418 Other specified anxiety disorders: Secondary | ICD-10-CM | POA: Diagnosis not present

## 2020-09-12 MED ORDER — VALACYCLOVIR HCL 1 G PO TABS
ORAL_TABLET | ORAL | 5 refills | Status: DC
  Filled 2020-09-12: qty 4, 1d supply, fill #0
  Filled 2020-09-26: qty 4, 1d supply, fill #1
  Filled 2021-04-26: qty 4, 1d supply, fill #2

## 2020-09-12 MED ORDER — VENLAFAXINE HCL ER 37.5 MG PO CP24
37.5000 mg | ORAL_CAPSULE | Freq: Every day | ORAL | 3 refills | Status: DC
  Filled 2020-09-12: qty 30, 30d supply, fill #0

## 2020-09-12 NOTE — Patient Instructions (Addendum)
Start cutting back on alcohol   Try using cpap for more hours to see if this helps   Exercise  Continue counseling  Consider couples counseling   Start effexor xr 37.5 mg daily  If intolerable side effects or worse -stop and go back to lexapro  Update me in 1-2 weeks and we will make a plan from there   We will make a follow up appt then

## 2020-09-12 NOTE — Assessment & Plan Note (Signed)
Worsened lately for no good reason Noting some marital issues -working on  Enc to continue counseling  No SI Reviewed stressors/ coping techniques/symptoms/ support sources/ tx options and side effects in detail today  Will try switch from lexapro 20 mg to effexor xr 37.5 mg with opt to inc to 75 mg  Will watch bp and labs  Discussed expectations of SNRI medication including time to effectiveness and mechanism of action, also poss of side effects (early and late)- including mental fuzziness, weight or appetite change, nausea and poss of worse dep or anxiety (even suicidal thoughts)  Pt voiced understanding and will stop med and update if this occurs  Enc good self care Enc return to exercise

## 2020-09-12 NOTE — Progress Notes (Signed)
Subjective:    Patient ID: Eric Blackburn, male    DOB: 1970/08/11, 50 y.o.   MRN: [id number]  This visit occurred during the SARS-CoV-2 public health emergency.  Safety protocols were in place, including screening questions prior to the visit, additional usage of staff PPE, and extensive cleaning of exam room while observing appropriate contact time as indicated for disinfecting solutions.   HPI Pt presents with worsening anxiety and depression   Wt Readings from Last 3 Encounters:  09/12/20 217 lb 5 oz (98.6 kg)  05/21/20 218 lb 6.4 oz (99.1 kg)  04/10/20 217 lb (98.4 kg)   30.10 kg/m  Currently takes wellbutrin xl 300 mg daily and lexapro 20 mg daily  Thinks the medicines work but perhaps his condition has changed  For the most part no side effects    Doing counseling-once per month  Coping -likes to escape to the outside   Stress level is about the same  Wife is anxious as well - that is difficult as well  On each other's nerves all the time  Has considered couples counseling  More irritable   Mother died this 50  Sister is working on the [employer] -uses cpap  Has done well/no recent bad nights   Exercise- walks regularly 2 mi if able  Wants to do more  This helps   Diet-not great   Alcohol - trying to cut back  Self treatment for him Does not feel dependent  3 beers per day (max 6)  No recreational drugs  Occ melatonin at night    No SI at all    In the past- prozac gave mild dry mouth   Patient Active Problem List   Diagnosis Date Noted   Hx of sessile serrated colonic polyps 04/10/2020   Colon cancer screening 01/16/2020   Depressed mood 04/04/2019   Shoulder pain 02/22/2019   Prostate cancer screening 09/10/2015   Rhinitis, nonallergic 04/16/2015   Obstructive sleep apnea 05/25/2013   Routine general medical examination at a health care facility 05/16/2013   Back pain 02/29/2008   Depression with anxiety 03/31/2007   HYPOSPADIAS  03/31/2007   Past Medical History:  Diagnosis Date   Anxiety    on meds   Depression    on meds   Fever blister    on meds   Hx of sessile serrated colonic polyps 04/10/2020   Sleep apnea    uses CPAP   Past Surgical History:  Procedure Laterality Date   ANTERIOR CRUCIATE LIGAMENT REPAIR Right 1992   HERNIA REPAIR Bilateral    x 2 as a child   Social History   Tobacco Use   Smoking status: Never   Smokeless tobacco: Never  Vaping Use   Vaping Use: Never used  Substance Use Topics   Alcohol use: Yes    Alcohol/week: 20.0 standard drinks    Types: 20 Standard drinks or equivalent per week    Comment: 1-4 drinks daily (beer)   Drug use: No   Family History  Problem Relation Age of Onset   Depression Mother    Diabetes type II Mother    Pulmonary Hypertension Mother    Stroke Sister 6       hemorrhagic post procedure   Anxiety disorder Sister    Depression Sister    Asthma Sister        developed as a child   Lung cancer Sister 14  small cell   Emphysema Maternal Grandfather    Prostate cancer Maternal Grandfather    Lymphoma Paternal Grandmother    Colon polyps Neg Hx    Colon cancer Neg Hx    Esophageal cancer Neg Hx    Stomach cancer Neg Hx    Rectal cancer Neg Hx    No Known Allergies Current Outpatient Medications on File Prior to Visit  Medication Sig Dispense Refill   buPROPion (WELLBUTRIN XL) 300 MG 24 hr tablet TAKE 1 TABLET BY MOUTH ONCE A DAY 90 tablet 3   ibuprofen (ADVIL) 200 MG tablet Take 400-800 mg by mouth every 6 (six) hours as needed (pain).     melatonin 5 MG TABS Take 1 tablet (5 mg total) by mouth at bedtime as needed. 30 tablet 12   Multiple Vitamin (MULTIVITAMIN) capsule Take 1 capsule by mouth daily.     Omega-3 Fatty Acids (FISH OIL) 1000 MG CAPS Take 1 capsule by mouth daily.     oxymetazoline (AFRIN) 0.05 % nasal spray Place 1 spray into both nostrils at bedtime. Alternate nostril     Turmeric (QC TUMERIC COMPLEX PO) Take  500 mg by mouth 2 (two) times daily.     No current facility-administered medications on file prior to visit.     Review of Systems  Constitutional:  Positive for fatigue. Negative for activity change, appetite change, fever and unexpected weight change.  HENT:  Negative for congestion, rhinorrhea, sore throat and trouble swallowing.   Eyes:  Negative for pain, redness, itching and visual disturbance.  Respiratory:  Negative for cough, chest tightness, shortness of breath and wheezing.   Cardiovascular:  Negative for chest pain and palpitations.  Gastrointestinal:  Negative for abdominal pain, blood in stool, constipation, diarrhea and nausea.  Endocrine: Negative for cold intolerance, heat intolerance, polydipsia and polyuria.  Genitourinary:  Negative for difficulty urinating, dysuria, frequency and urgency.  Musculoskeletal:  Negative for arthralgias, joint swelling and myalgias.  Skin:  Negative for pallor and rash.  Neurological:  Negative for dizziness, tremors, weakness, numbness and headaches.  Hematological:  Negative for adenopathy. Does not bruise/bleed easily.  Psychiatric/Behavioral:  Positive for dysphoric mood and sleep disturbance. Negative for decreased concentration and suicidal ideas. The patient is nervous/anxious.       Objective:   Physical Exam Constitutional:      General: He is not in acute distress.    Appearance: Normal appearance. He is obese. He is not ill-appearing or diaphoretic.  HENT:     Mouth/Throat:     Mouth: Mucous membranes are moist.  Eyes:     General: No scleral icterus.    Conjunctiva/sclera: Conjunctivae normal.     Pupils: Pupils are equal, round, and reactive to light.  Neck:     Vascular: No carotid bruit.  Cardiovascular:     Rate and Rhythm: Normal rate and regular rhythm.     Heart sounds: Normal heart sounds.  Pulmonary:     Effort: Pulmonary effort is normal. No respiratory distress.     Breath sounds: Normal breath sounds.  No wheezing.  Musculoskeletal:     Cervical back: Normal range of motion and neck supple. No rigidity or tenderness.     Right lower leg: No edema.     Left lower leg: No edema.  Skin:    Coloration: Skin is not pale.     Findings: No erythema or rash.     Comments: Solar lentigines diffusely   Neurological:  Mental Status: He is alert.     Sensory: No sensory deficit.     Coordination: Coordination normal.     Deep Tendon Reflexes: Reflexes normal.     Comments: No tremor  Psychiatric:        Attention and Perception: Attention normal.        Mood and Affect: Affect is blunt. Affect is not tearful.        Speech: Speech normal.        Behavior: Behavior normal.        Cognition and Memory: Cognition and memory normal.     Comments: Talks candidly about stressors and symptoms    .        Assessment & Plan:   Problem List Items Addressed This Visit       Other   Depression with anxiety - Primary    Worsened lately for no good reason Noting some marital issues -working on  Enc to continue counseling  No SI Reviewed stressors/ coping techniques/symptoms/ support sources/ tx options and side effects in detail today  Will try switch from lexapro 20 mg to effexor xr 37.5 mg with opt to inc to 75 mg  Will watch bp and labs  Discussed expectations of SNRI medication including time to effectiveness and mechanism of action, also poss of side effects (early and late)- including mental fuzziness, weight or appetite change, nausea and poss of worse dep or anxiety (even suicidal thoughts)  Pt voiced understanding and will stop med and update if this occurs  Enc good self care Enc return to exercise         Relevant Medications   venlafaxine XR (EFFEXOR XR) 37.5 MG 24 hr capsule

## 2020-09-19 ENCOUNTER — Encounter: Payer: Self-pay | Admitting: Family Medicine

## 2020-09-19 ENCOUNTER — Telehealth: Payer: Self-pay | Admitting: *Deleted

## 2020-09-19 DIAGNOSIS — G4733 Obstructive sleep apnea (adult) (pediatric): Secondary | ICD-10-CM | POA: Diagnosis not present

## 2020-09-19 NOTE — Telephone Encounter (Signed)
Pt sent a message to f/u on his recent appt with PCP. Pt said:  He hasn't had any side effects and after 4 days on 37.5mg  so he increased to 75mg . his mood may be a little better but he's not 100% sure.

## 2020-09-19 NOTE — Telephone Encounter (Signed)
Sent Dr. Glori Bickers a message letting her know pt's comments

## 2020-09-19 NOTE — Telephone Encounter (Signed)
Thanks for letting me know  Stick with it  Let me know how he feels in the next month or earlier if any problems

## 2020-09-20 DIAGNOSIS — D225 Melanocytic nevi of trunk: Secondary | ICD-10-CM | POA: Diagnosis not present

## 2020-09-20 DIAGNOSIS — L821 Other seborrheic keratosis: Secondary | ICD-10-CM | POA: Diagnosis not present

## 2020-09-20 NOTE — Telephone Encounter (Signed)
Sent mychart letting pt know Dr. Tower's comments. 

## 2020-09-24 ENCOUNTER — Encounter: Payer: Self-pay | Admitting: Family Medicine

## 2020-09-24 MED ORDER — VENLAFAXINE HCL ER 75 MG PO CP24
75.0000 mg | ORAL_CAPSULE | Freq: Every day | ORAL | 3 refills | Status: DC
  Filled 2020-09-24: qty 90, 90d supply, fill #0

## 2020-09-25 ENCOUNTER — Other Ambulatory Visit (HOSPITAL_COMMUNITY): Payer: Self-pay

## 2020-09-26 ENCOUNTER — Other Ambulatory Visit (HOSPITAL_COMMUNITY): Payer: Self-pay

## 2020-10-09 ENCOUNTER — Encounter: Payer: Self-pay | Admitting: Family Medicine

## 2020-10-11 ENCOUNTER — Other Ambulatory Visit (HOSPITAL_COMMUNITY): Payer: Self-pay

## 2020-10-11 MED ORDER — CARESTART COVID-19 HOME TEST VI KIT
PACK | 0 refills | Status: DC
  Filled 2020-10-11: qty 4, 4d supply, fill #0

## 2020-10-17 DIAGNOSIS — F4323 Adjustment disorder with mixed anxiety and depressed mood: Secondary | ICD-10-CM | POA: Diagnosis not present

## 2020-10-25 DIAGNOSIS — G4733 Obstructive sleep apnea (adult) (pediatric): Secondary | ICD-10-CM | POA: Diagnosis not present

## 2020-10-31 ENCOUNTER — Other Ambulatory Visit (HOSPITAL_COMMUNITY): Payer: Self-pay

## 2020-10-31 MED FILL — Bupropion HCl Tab ER 24HR 300 MG: ORAL | 90 days supply | Qty: 90 | Fill #1 | Status: AC

## 2020-11-15 DIAGNOSIS — F4323 Adjustment disorder with mixed anxiety and depressed mood: Secondary | ICD-10-CM | POA: Diagnosis not present

## 2020-12-10 ENCOUNTER — Encounter: Payer: Self-pay | Admitting: Family Medicine

## 2020-12-10 MED ORDER — VENLAFAXINE HCL ER 150 MG PO CP24
150.0000 mg | ORAL_CAPSULE | Freq: Every day | ORAL | 3 refills | Status: DC
  Filled 2020-12-10: qty 90, 90d supply, fill #0
  Filled 2021-03-15: qty 90, 90d supply, fill #1
  Filled 2021-06-18: qty 90, 90d supply, fill #2
  Filled 2021-09-16: qty 90, 90d supply, fill #3

## 2020-12-11 ENCOUNTER — Other Ambulatory Visit (HOSPITAL_COMMUNITY): Payer: Self-pay

## 2020-12-13 DIAGNOSIS — G4733 Obstructive sleep apnea (adult) (pediatric): Secondary | ICD-10-CM | POA: Diagnosis not present

## 2020-12-20 DIAGNOSIS — H5213 Myopia, bilateral: Secondary | ICD-10-CM | POA: Diagnosis not present

## 2021-01-09 DIAGNOSIS — G4733 Obstructive sleep apnea (adult) (pediatric): Secondary | ICD-10-CM | POA: Diagnosis not present

## 2021-01-27 ENCOUNTER — Other Ambulatory Visit: Payer: Self-pay | Admitting: Family Medicine

## 2021-01-27 IMAGING — MR MR SHOULDER*R* W/O CM
5 series · 40 of 40 positions shown · non-contrast
Comparison: None.

CLINICAL DATA: Chronic right shoulder pain and limited range of
motion.

EXAM:
MRI OF THE RIGHT SHOULDER WITHOUT CONTRAST
TECHNIQUE: Multiplanar, multisequence MR imaging of the shoulder was performed.
No intravenous contrast was administered.

[Series 5: PD fat-sat · axial · right · 4.0mm · 0.50mm/px · z∈[-85,+33]mm · 8 of 29 slices shown (1 of 2)]
[im 1/29]
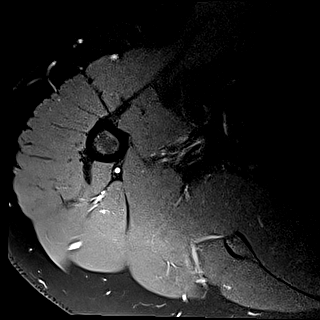
[im 5/29]
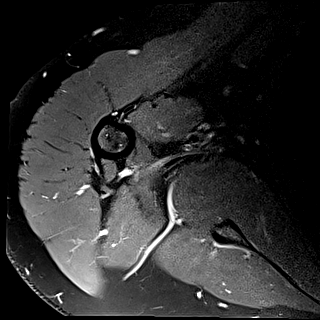
[im 9/29]
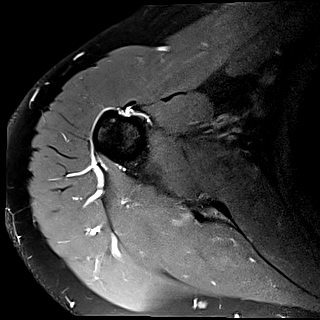
[im 13/29]
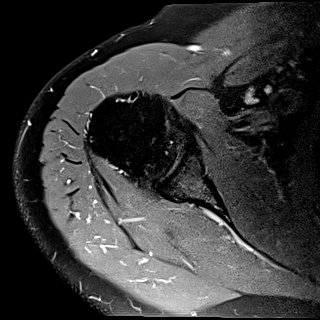
[im 17/29]
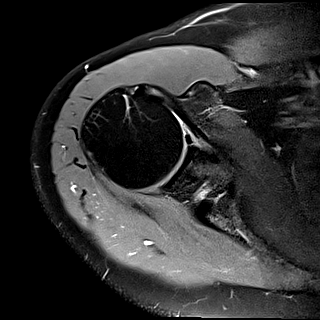
[im 21/29]
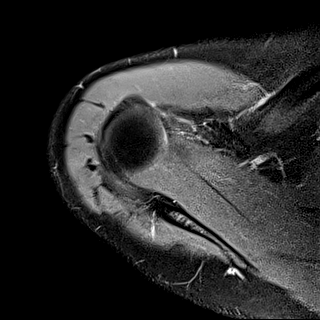
[im 25/29]
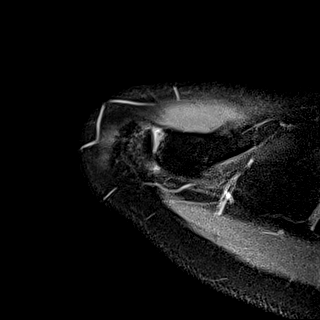
[im 29/29]
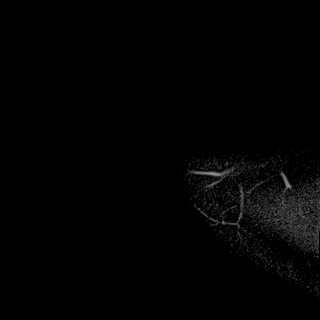

[Series 6: T2 fat-sat · oblique · right · 4.0mm · 0.50mm/px · 7 of 23 slices shown (1 of 2)]
[im 1/23]
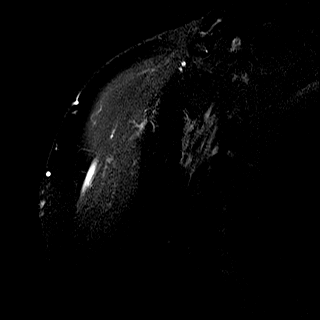
[im 4/23]
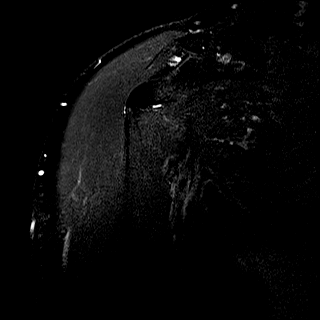
[im 8/23]
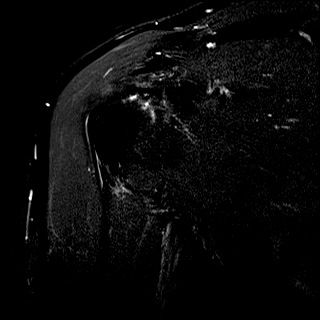
[im 12/23]
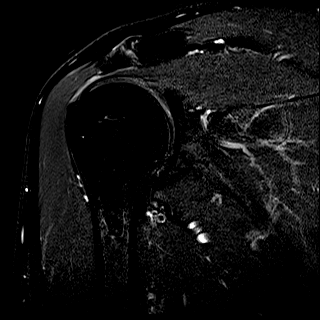
[im 15/23]
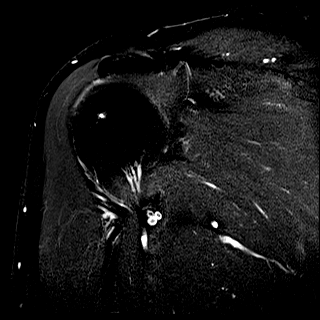
[im 19/23]
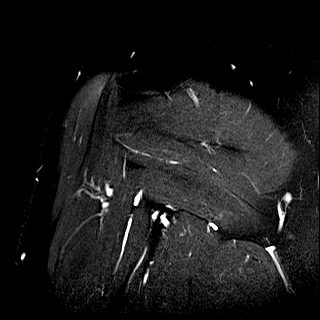
[im 23/23]
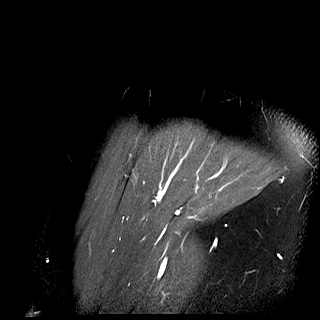

[Series 7: PD fat-sat · oblique · right · 4.0mm · 0.50mm/px · 7 of 23 slices shown (2 of 2)]
[im 1/23]
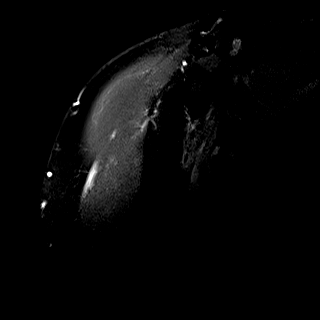
[im 4/23]
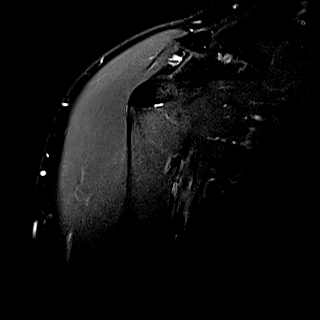
[im 8/23]
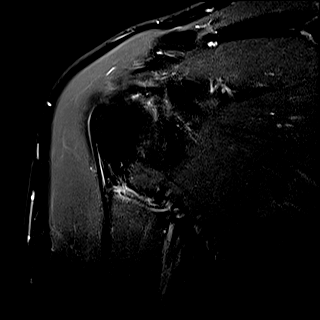
[im 12/23]
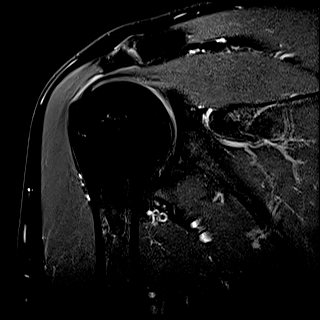
[im 15/23]
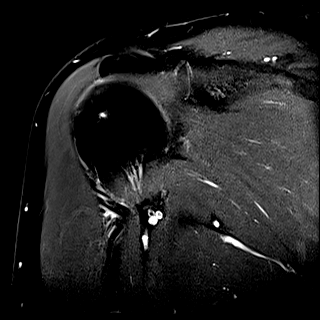
[im 19/23]
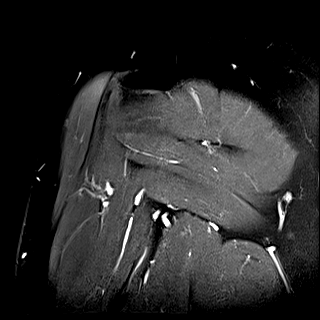
[im 23/23]
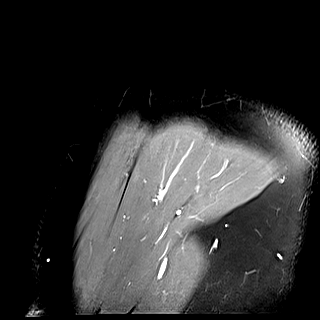

[Series 8: T1 · oblique · right · 3.0mm · 0.59mm/px · 9 of 30 slices shown]
[im 1/30]
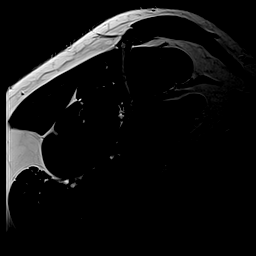
[im 4/30]
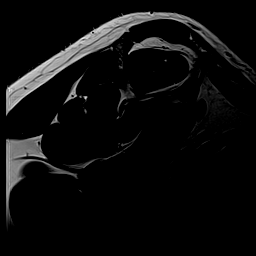
[im 8/30]
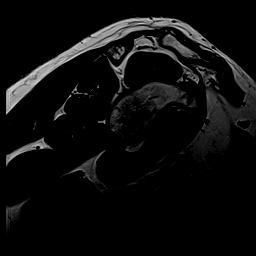
[im 11/30]
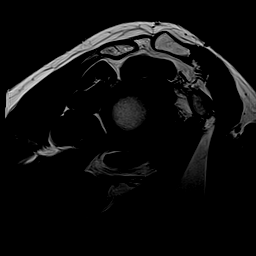
[im 15/30]
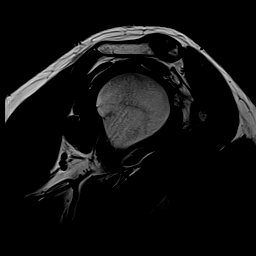
[im 19/30]
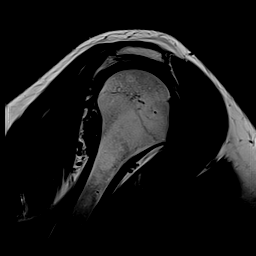
[im 22/30]
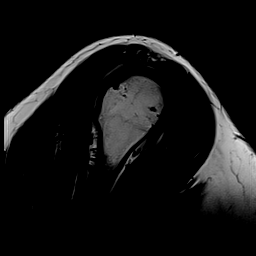
[im 26/30]
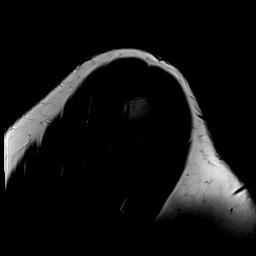
[im 30/30]
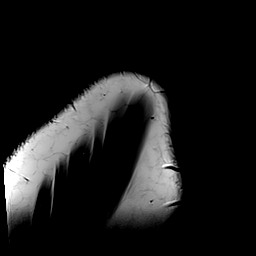

[Series 9: T2 fat-sat · oblique · right · 3.0mm · 0.59mm/px · 9 of 30 slices shown (2 of 2)]
[im 1/30]
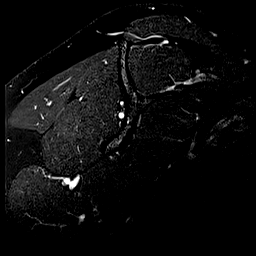
[im 4/30]
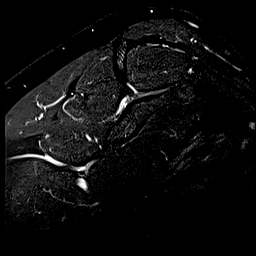
[im 8/30]
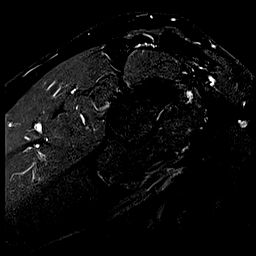
[im 11/30]
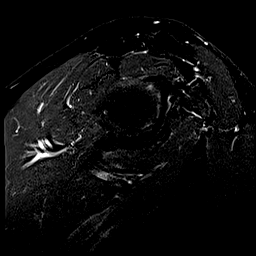
[im 15/30]
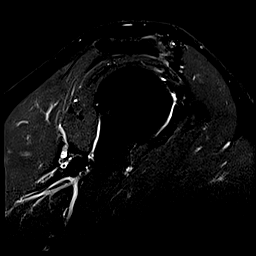
[im 19/30]
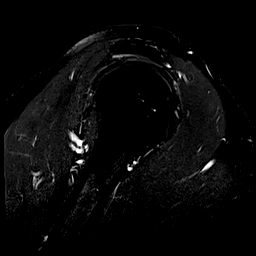
[im 22/30]
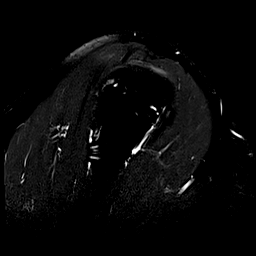
[im 26/30]
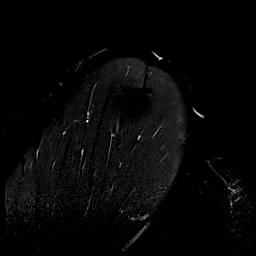
[im 30/30]
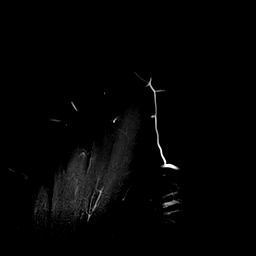

[40 of 40 positions shown; findings below may reference images not displayed]

FINDINGS: Rotator cuff:  Intact rotator cuff.

Muscles: No atrophy or abnormal signal of the muscles of the rotator
cuff.

Biceps long head:  Intact and normally positioned.

Acromioclavicular Joint: Normal acromioclavicular joint. Type II
acromion. No subacromial/subdeltoid bursal fluid.

Glenohumeral Joint: No joint effusion. No chondral defect.

Labrum: Suspected small tear of the posteroinferior labrum (series
5, images 15 and 16). Remaining labrum is grossly intact, although
evaluation is limited due to lack of intra-articular fluid.

Bones:  No marrow abnormality, fracture or dislocation.

Other: None.
IMPRESSION: 1. Suspected small tear of the posteroinferior labrum.
2. No rotator cuff tendon tear.

## 2021-01-28 ENCOUNTER — Other Ambulatory Visit (HOSPITAL_COMMUNITY): Payer: Self-pay

## 2021-01-28 MED ORDER — BUPROPION HCL ER (XL) 300 MG PO TB24
ORAL_TABLET | Freq: Every day | ORAL | 1 refills | Status: DC
  Filled 2021-01-28: qty 90, 90d supply, fill #0
  Filled 2021-04-26: qty 90, 90d supply, fill #1

## 2021-02-08 DIAGNOSIS — G4733 Obstructive sleep apnea (adult) (pediatric): Secondary | ICD-10-CM | POA: Diagnosis not present

## 2021-03-15 ENCOUNTER — Other Ambulatory Visit (HOSPITAL_COMMUNITY): Payer: Self-pay

## 2021-04-22 DIAGNOSIS — G4733 Obstructive sleep apnea (adult) (pediatric): Secondary | ICD-10-CM | POA: Diagnosis not present

## 2021-04-26 ENCOUNTER — Other Ambulatory Visit (HOSPITAL_COMMUNITY): Payer: Self-pay

## 2021-05-01 ENCOUNTER — Encounter: Payer: Self-pay | Admitting: Family Medicine

## 2021-05-01 ENCOUNTER — Other Ambulatory Visit (HOSPITAL_COMMUNITY): Payer: Self-pay

## 2021-05-01 ENCOUNTER — Telehealth: Payer: 59 | Admitting: Physician Assistant

## 2021-05-01 DIAGNOSIS — H00011 Hordeolum externum right upper eyelid: Secondary | ICD-10-CM | POA: Diagnosis not present

## 2021-05-01 MED ORDER — ERYTHROMYCIN 5 MG/GM OP OINT
1.0000 "application " | TOPICAL_OINTMENT | Freq: Every day | OPHTHALMIC | 0 refills | Status: DC
  Filled 2021-05-01: qty 3.5, 7d supply, fill #0

## 2021-05-01 NOTE — Progress Notes (Signed)
?  E-Visit for Stye ? ? ?We are sorry that you are not feeling well. Here is how we plan to help! ? ?Based on what you have shared with me it looks like you have a stye.  A stye is an inflammation of the eyelid.  It is often a red, painful lump near the edge of the eyelid that may look like a boil or a pimple.  A stye develops when an infection occurs at the base of an eyelash.  ? ?We have made appropriate suggestions for you based upon your presentation: ?Simple styes can be treated without medical intervention.  Most styes either resolve spontaneously or resolve with simple home treatment by applying warm compresses or heated washcloth to the stye for about 10-15 minutes three to four times a day. This causes the stye to drain and resolve. I will prescribe erythromycin ointment to apply at bedtime for 5-7 days. ? ?HOME CARE: ? ?Wash your hands often! ?Let the stye open on its own. Don't squeeze or open it. ?Don't rub your eyes. This can irritate your eyes and let in bacteria.  If you need to touch your eyes, wash your hands first. ?Don't wear eye makeup or contact lenses until the area has healed. ? ?GET HELP RIGHT AWAY IF: ? ?Your symptoms do not improve. ?You develop blurred or loss of vision. ?Your symptoms worsen (increased discharge, pain or redness). ? ? ?Thank you for choosing an e-visit. ? ?Your e-visit answers were reviewed by a board certified advanced clinical practitioner to complete your personal care plan. Depending upon the condition, your plan could have included both over the counter or prescription medications. ? ?Please review your pharmacy choice. Make sure the pharmacy is open so you can pick up prescription now. If there is a problem, you may contact your provider through [employer] and have the prescription routed to another pharmacy.  Your safety is important to [place]. If you have drug allergies check your prescription carefully.  ? ?For the next 24 hours you can use MyChart to ask  questions about today's visit, request a non-urgent call back, or ask for a work or school excuse. ?You will get an email in the next two days asking about your experience. I hope that your e-visit has been valuable and will speed your recovery. ? ?I provided 5 minutes of non face-to-face time during this encounter for chart review and documentation.  ? ?

## 2021-05-02 DIAGNOSIS — D485 Neoplasm of uncertain behavior of skin: Secondary | ICD-10-CM | POA: Diagnosis not present

## 2021-05-02 DIAGNOSIS — L821 Other seborrheic keratosis: Secondary | ICD-10-CM | POA: Diagnosis not present

## 2021-05-02 DIAGNOSIS — D225 Melanocytic nevi of trunk: Secondary | ICD-10-CM | POA: Diagnosis not present

## 2021-05-20 DIAGNOSIS — G4733 Obstructive sleep apnea (adult) (pediatric): Secondary | ICD-10-CM | POA: Diagnosis not present

## 2021-05-22 NOTE — Progress Notes (Signed)
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 ?----------------------------------------------------------------------------------------------------- ? ? ?05/21/20- 51 year old male never smoker, [employer], followed for OSA, nasal congestion, complicated by low back pain, depression, rhinitis,  ?CPAP auto 5-15/Ada[employer] ?Download- compliance 74%, AHI 0.4/ hr ?Body weight today-218 lbs ?Covid vax- 3 Phizer ?Flu vax-had ?Prefers nasal mask with chin strap. Considering travel machine.  ?Discussed Inspire- not interested.  ?Occasional melatonin helps insomnia. ? ?05/23/21- 51 year old male never smoker, [employer], followed for OSA, nasal congestion, complicated by low back pain, depression, rhinitis,  ?CPAP auto 5-15/Adapt ?Download- compliance 77%, AHI 0.3/ hr ?Body weight today- ?Covid vax- 3 Phizer ?Flu vax-had ?He was curious about a travel machine last year. ?If he does not use CPAP he snores and his wife complains so compliance is usually pretty good.  Download reviewed.  Mostly short nights.  Still bothered by nasal congestion.  Trying to alternate Afrin every other night.  He would like to learn about ENT options for his nose and also discussed the possibility of Inspire. ? ?ROS-see HPI  + = positive ?Constitutional:   No-   weight loss, night sweats, fevers, chills, fatigue, lassitude. ?HEENT:   No-  headaches, difficulty swallowing, tooth/dental problems, sore throat,  ?     No-  sneezing, itching, ear ache, + nasal congestion, post nasal drip,  ?CV:  No-   chest pain, orthopnea, PND, swelling in lower extremities, anasarca,                                                   ? dizziness, palpitations ?Resp: No-   shortness of breath with exertion or at rest.   ?           No-   productive cough,  No non-productive cough,  No- coughing up of blood.   ?            No-   change in color of mucus.  No- wheezing.   ?Skin: No-   rash or lesions. ?GI:  No-   heartburn, indigestion, abdominal pain, nausea, vomiting,  ?GU:  ?MS:  No-   joint pain or swelling.   ?Neuro-     nothing unusual ?Psych:  No- change in mood or affect. + depression or anxiety.  No memory loss. ? ?OBJ- Physical Exam   ?General- Alert, Oriented, Affect-appropriate, Distress- none acute, medium build ?Skin- rash-none, lesions- none, excoriation- none ?Lymphadenopathy- none ?Head- atraumatic ?           Eyes- Gross vision intact, PERRLA, conjunctivae and secretions clear ?           Ears- Hearing, canals-normal ?           Nose- Clear, no-Septal dev, mucus, polyps, erosion, perforation  ?           Throat- +Small mandible, Mallampati III , mucosa clear , drainage- none, tonsils- atrophic ?Neck- flexible , trachea midline, no stridor , thyroid nl, carotid no bruit ?Chest - symmetrical excursion , unlabored ?          Heart/CV- RRR , no murmur , no gallop  , no rub, nl s1 s2 ?                          -  JVD- none , edema- none, stasis changes- none, varices- none ?          Lung- clear to P&A, wheeze- none, cough- none , dullness-none, rub- none ?          Chest wall-  ?Abd-  ?Br/ Gen/ Rectal- Not done, not indicated ?Extrem- cyanosis- none, clubbing, none, atrophy- none, strength- nl ?Neuro- grossly intact to observation ? ? ? ?

## 2021-05-23 ENCOUNTER — Ambulatory Visit (INDEPENDENT_AMBULATORY_CARE_PROVIDER_SITE_OTHER): Payer: 59 | Admitting: Internal Medicine

## 2021-05-23 ENCOUNTER — Encounter: Payer: Self-pay | Admitting: Internal Medicine

## 2021-05-23 VITALS — BP 120/88 | HR 70 | Temp 98.1°F | Ht 71.0 in | Wt 214.4 lb

## 2021-05-23 DIAGNOSIS — G4733 Obstructive sleep apnea (adult) (pediatric): Secondary | ICD-10-CM | POA: Diagnosis not present

## 2021-05-23 DIAGNOSIS — J310 Chronic rhinitis: Secondary | ICD-10-CM | POA: Diagnosis not present

## 2021-05-23 NOTE — Patient Instructions (Signed)
Order- DME [employer]- please reduce the autopap range to 4-10 ? ?Order- referral to Drs [facility]     consider [facility] for OSA ? ?Please call if we can help ?

## 2021-05-27 ENCOUNTER — Encounter: Payer: Self-pay | Admitting: Internal Medicine

## 2021-05-27 NOTE — Telephone Encounter (Signed)
Mychart message sent by pt: ?[facility] (supporting Baird Lyons D, MD) 2 hours ago (11:26 AM)  ? ?I meant to ask you about what I can do about clearing my throat frequently. Most days, year round, I feel the need to clear my throat every few minutes. It annoying to me and the people around me. I feels like mucous collects in my throat. Mucinex and water helps. Should I just wait and talk to the ENT about it? ? ? ? ? ? ?Dr. Annamaria Boots, please advise on this for pt. ?

## 2021-05-27 NOTE — Telephone Encounter (Signed)
This can be self-perpetuating. Try to emphasize sips of liquids, throat lozenges and deliberate swallowing rather than throat clearing. ?Try acid blocker like otc Pepcid or Omeprazole for a month. ?Try regular use of antihistamine like clairitin. ?Then discuss with ENT. ?

## 2021-06-19 ENCOUNTER — Other Ambulatory Visit (HOSPITAL_COMMUNITY): Payer: Self-pay

## 2021-06-19 NOTE — Assessment & Plan Note (Signed)
Discussed options including steroid nasal spray, saline nasal spray, Sudafed.  Educated to watch out for rebound potential with overuse of Afrin. ?

## 2021-06-19 NOTE — Assessment & Plan Note (Signed)
Benefits from CPAP and uses it every night but blames difficulty breathing through his nose for frequent short nights. ?Plan-reduce pressure range to auto 5-10.  Refer to ENT for evaluation of nasal airway and discussion of Inspire ?

## 2021-07-02 DIAGNOSIS — G4733 Obstructive sleep apnea (adult) (pediatric): Secondary | ICD-10-CM | POA: Diagnosis not present

## 2021-08-04 ENCOUNTER — Other Ambulatory Visit: Payer: Self-pay | Admitting: Family Medicine

## 2021-08-05 ENCOUNTER — Encounter: Payer: Self-pay | Admitting: Family Medicine

## 2021-08-05 ENCOUNTER — Other Ambulatory Visit (HOSPITAL_COMMUNITY): Payer: Self-pay

## 2021-08-06 ENCOUNTER — Other Ambulatory Visit (HOSPITAL_COMMUNITY): Payer: Self-pay

## 2021-08-06 MED ORDER — BUPROPION HCL ER (XL) 300 MG PO TB24
ORAL_TABLET | Freq: Every day | ORAL | 0 refills | Status: DC
  Filled 2021-08-06: qty 90, 90d supply, fill #0

## 2021-08-29 DIAGNOSIS — L821 Other seborrheic keratosis: Secondary | ICD-10-CM | POA: Diagnosis not present

## 2021-08-29 DIAGNOSIS — D225 Melanocytic nevi of trunk: Secondary | ICD-10-CM | POA: Diagnosis not present

## 2021-09-02 DIAGNOSIS — G4733 Obstructive sleep apnea (adult) (pediatric): Secondary | ICD-10-CM | POA: Diagnosis not present

## 2021-09-04 DIAGNOSIS — G4730 Sleep apnea, unspecified: Secondary | ICD-10-CM | POA: Diagnosis not present

## 2021-09-04 DIAGNOSIS — Z9989 Dependence on other enabling machines and devices: Secondary | ICD-10-CM | POA: Diagnosis not present

## 2021-09-05 ENCOUNTER — Encounter: Payer: Self-pay | Admitting: Internal Medicine

## 2021-09-05 DIAGNOSIS — G4733 Obstructive sleep apnea (adult) (pediatric): Secondary | ICD-10-CM

## 2021-09-05 NOTE — Telephone Encounter (Signed)
Dr. Annamaria Boots, please see pt's email regarding an in-lab sleep study for the inspire device.

## 2021-09-06 NOTE — Telephone Encounter (Signed)
Order has been placed for NPSG. Nothing Further needed at this time.

## 2021-09-06 NOTE — Telephone Encounter (Signed)
Order- schedule NPSG    dx OSA     Patient requests In-lab study, considering Inspire

## 2021-09-16 ENCOUNTER — Other Ambulatory Visit (HOSPITAL_COMMUNITY): Payer: Self-pay

## 2021-09-17 ENCOUNTER — Other Ambulatory Visit (HOSPITAL_COMMUNITY): Payer: Self-pay

## 2021-09-17 ENCOUNTER — Ambulatory Visit (INDEPENDENT_AMBULATORY_CARE_PROVIDER_SITE_OTHER): Payer: 59 | Admitting: Family Medicine

## 2021-09-17 ENCOUNTER — Encounter: Payer: Self-pay | Admitting: Family Medicine

## 2021-09-17 VITALS — BP 118/80 | HR 83 | Ht 71.0 in | Wt 215.8 lb

## 2021-09-17 DIAGNOSIS — Z0000 Encounter for general adult medical examination without abnormal findings: Secondary | ICD-10-CM | POA: Diagnosis not present

## 2021-09-17 DIAGNOSIS — Z125 Encounter for screening for malignant neoplasm of prostate: Secondary | ICD-10-CM

## 2021-09-17 DIAGNOSIS — Z1211 Encounter for screening for malignant neoplasm of colon: Secondary | ICD-10-CM

## 2021-09-17 DIAGNOSIS — F418 Other specified anxiety disorders: Secondary | ICD-10-CM

## 2021-09-17 MED ORDER — DESVENLAFAXINE SUCCINATE ER 100 MG PO TB24
100.0000 mg | ORAL_TABLET | Freq: Every day | ORAL | 3 refills | Status: DC
  Filled 2021-09-17: qty 90, 90d supply, fill #0

## 2021-09-17 NOTE — Patient Instructions (Addendum)
Take care of yourself   Switch from effexor xr to pristiq 100 mg daily  If any issues or problems or if you feel worse let me know  Stay active !  I do recommend a shingrix vaccine   Labs today    For mental health counseling here are some options to call    [facility] center [address], [place] [address]  [phone]  [facility]/ Dr Lennice Sites Address: [address] [place], [place] [address] Phone: [phone]  [facility] Address: [address], [place], [place] [address] Phone: [phone]  [facility] [address] [place], [place] [address] [phone]   If you need a referral let us know  I can also refer you to a [place] behavioral health counselor, let me know

## 2021-09-17 NOTE — Assessment & Plan Note (Signed)
Reviewed health habits including diet and exercise and skin cancer prevention Reviewed appropriate screening tests for age  Also reviewed health mt list, fam hx and immunization status , as well as social and family history   Labs ordered  Discussed shingrix vaccine and he will check on coverage Colonoscopy utd 2022 with 5 y recall  psa ordered/no voiding changes  Treated for OSA/new sleep study pending  PHQ 10- discussed med change and ref to counseling Encouraged exercise and self care

## 2021-09-17 NOTE — Assessment & Plan Note (Signed)
Baseline nocturia is unchanged  psa ordered  Fam h/o cancer in PGF very late in life

## 2021-09-17 NOTE — Progress Notes (Signed)
Subjective:    Patient ID: Eric Blackburn, male    DOB: Aug 14, 1970, 51 y.o.   MRN: [id number]  HPI Here for health maintenance exam and to review chronic medical problems    Wt Readings from Last 3 Encounters:  09/17/21 215 lb 12.8 oz (97.9 kg)  05/23/21 214 lb 6.4 oz (97.3 kg)  09/12/20 217 lb 5 oz (98.6 kg)   30.10 kg/m  Struggling with some depression   Walking for exercise - this helps mental health also  Less motivated when he feels down  Wants to get back into hiking   Immunization History  Administered Date(s) Administered   Influenza Split 11/24/2013, 10/26/2014, 11/25/2015   Influenza,inj,Quad PF,6+ Mos 11/13/2016, 12/04/2020   Influenza,inj,quad, With Preservative 11/25/2018   Influenza-Unspecified 11/24/2012, 11/08/2017, 12/13/2018, 11/09/2019   PFIZER(Purple Top)SARS-COV-2 Vaccination 11/05/2018, 11/26/2018, 12/09/2019   Tdap 05/25/2013   Health Maintenance Due  Topic Date Due   HIV Screening  Never done   Hepatitis C Screening  Never done   Zoster Vaccines- Shingrix (1 of 2) Never done   COVID-19 Vaccine (4 - Pfizer series) 02/03/2020   Zoster status :  is interested in shingrix / check on coverage   Colonoscopy 03/2020 with 5 y recall   Gets flu shot in the fall   OSA Due for a repeat sleep study/is investigating the inspire device right now  Sees Dr Annamaria Boots  Uses cpap - but it is a hassle  Sometimes takes it off in his sleep and this would be trouble with traveling    BP Readings from Last 3 Encounters:  09/17/21 118/80  05/23/21 120/88  09/12/20 132/78   Pulse Readings from Last 3 Encounters:  09/17/21 83  05/23/21 70  09/12/20 73    Prostate health Lab Results  Component Value Date   PSA 2.21 01/10/2020   PSA 1.29 01/12/2019   PSA 1.11 12/28/2017   No prostate problems  Drinks a lot of liquids so he does have nocturia  Stream is fine/has not changed  from his baseline   MGF had prostate cancer in late life (died in 73s)     H/o depression with anxiety  Worse lately  Stress with his wife-she has started drinking again   Also has a lot of good things going on  Daughters are doing well    PHQ    09/17/2021   11:26 AM 09/12/2020    3:06 PM 10/20/2019   11:43 AM 01/13/2019    2:05 PM 12/28/2017    8:47 AM  Depression screen PHQ 2/9  Decreased Interest '2 1 1 1 '$ 0  Down, Depressed, Hopeless '2 2 2 '$ 0 0  PHQ - 2 Score '4 3 3 1 '$ 0  Altered sleeping 1 2 0 0   Tired, decreased energy '2 2 1 '$ 0   Change in appetite '2 3 1 1   '$ Feeling bad or failure about yourself  0 1 1 0   Trouble concentrating '1 1 3 1   '$ Moving slowly or fidgety/restless 0 0 0 0   Suicidal thoughts 0 0 0 0   PHQ-9 Score '10 12 9 3   '$ Difficult doing work/chores Somewhat difficult Somewhat difficult  Not difficult at all     Effexor xr 150 mg daily  Buproprion xl 300 mg daily   Interested in going back to therapy  Had some trauma as a child  Wants to see someone new     Due for labs   Lab Results  Component Value Date   CHOL 177 01/10/2020   HDL 65.40 01/10/2020   LDLCALC 101 (H) 01/10/2020   TRIG 53.0 01/10/2020   CHOLHDL 3 01/10/2020   Sister had stroke at 55  Eats everything for the most part but not terrible  Lots of salads  Thinks he eats too much in general  Some sweets   Occ fast food is at most once weekly  The 10-year ASCVD risk score (Arnett DK, et al., 2019) is: 2.5%   Values used to calculate the score:     Age: 76 years     Sex: Male     Is Non-Hispanic African American: No     Diabetic: No     Tobacco smoker: No     Systolic Blood Pressure: 428 mmHg     Is BP treated: No     HDL Cholesterol: 65.4 mg/dL     Total Cholesterol: 177 mg/dL  Patient Active Problem List   Diagnosis Date Noted   Hx of sessile serrated colonic polyps 04/10/2020   Colon cancer screening 01/16/2020   Depressed mood 04/04/2019   Shoulder pain 02/22/2019   Prostate cancer screening 09/10/2015   Rhinitis, nonallergic 04/16/2015    Obstructive sleep apnea 05/25/2013   Routine general medical examination at a health care facility 05/16/2013   Back pain 02/29/2008   Depression with anxiety 03/31/2007   HYPOSPADIAS 03/31/2007   Past Medical History:  Diagnosis Date   Anxiety    on meds   Depression    on meds   Fever blister    on meds   Hx of sessile serrated colonic polyps 04/10/2020   Sleep apnea    uses CPAP   Past Surgical History:  Procedure Laterality Date   ANTERIOR CRUCIATE LIGAMENT REPAIR Right 1992   HERNIA REPAIR Bilateral    x 2 as a child   Social History   Tobacco Use   Smoking status: Never   Smokeless tobacco: Never  Vaping Use   Vaping Use: Never used  Substance Use Topics   Alcohol use: Yes    Alcohol/week: 20.0 standard drinks of alcohol    Types: 20 Standard drinks or equivalent per week    Comment: 1-4 drinks daily (beer)   Drug use: No   Family History  Problem Relation Age of Onset   Depression Mother    Diabetes type II Mother    Pulmonary Hypertension Mother    Stroke Sister 64       hemorrhagic post procedure   Anxiety disorder Sister    Depression Sister    Asthma Sister        developed as a child   Lung cancer Sister 48       small cell   Emphysema Maternal Grandfather    Prostate cancer Maternal Grandfather    Lymphoma Paternal Grandmother    Colon polyps Neg Hx    Colon cancer Neg Hx    Esophageal cancer Neg Hx    Stomach cancer Neg Hx    Rectal cancer Neg Hx    No Known Allergies Current Outpatient Medications on File Prior to Visit  Medication Sig Dispense Refill   buPROPion (WELLBUTRIN XL) 300 MG 24 hr tablet TAKE 1 TABLET BY MOUTH ONCE A DAY 90 tablet 0   ibuprofen (ADVIL) 200 MG tablet Take 400-800 mg by mouth every 6 (six) hours as needed (pain).     melatonin 5 MG TABS Take 1 tablet (5 mg total) by mouth  at bedtime as needed. 30 tablet 12   Multiple Vitamin (MULTIVITAMIN) capsule Take 1 capsule by mouth daily.     Omega-3 Fatty Acids (FISH  OIL) 1000 MG CAPS Take 1 capsule by mouth 2 (two) times daily.     oxymetazoline (AFRIN) 0.05 % nasal spray Place 1 spray into both nostrils at bedtime. Alternate nostril     Turmeric (QC TUMERIC COMPLEX PO) Take 500 mg by mouth daily.     valACYclovir (VALTREX) 1000 MG tablet TAKE 2 TABLETS BY MOUTH TWO TIMES A DAY FOR 1 DAY FOR COLD SORE AS NEEDED. 4 tablet 5   No current facility-administered medications on file prior to visit.       Review of Systems  Constitutional:  Positive for fatigue. Negative for activity change, appetite change, fever and unexpected weight change.  HENT:  Negative for congestion, rhinorrhea, sore throat and trouble swallowing.   Eyes:  Negative for pain, redness, itching and visual disturbance.  Respiratory:  Negative for cough, chest tightness, shortness of breath and wheezing.   Cardiovascular:  Negative for chest pain and palpitations.  Gastrointestinal:  Negative for abdominal pain, blood in stool, constipation, diarrhea and nausea.  Endocrine: Negative for cold intolerance, heat intolerance, polydipsia and polyuria.  Genitourinary:  Negative for difficulty urinating, dysuria, frequency and urgency.  Musculoskeletal:  Negative for arthralgias, joint swelling and myalgias.  Skin:  Negative for pallor and rash.  Neurological:  Negative for dizziness, tremors, weakness, numbness and headaches.  Hematological:  Negative for adenopathy. Does not bruise/bleed easily.  Psychiatric/Behavioral:  Positive for dysphoric mood. Negative for decreased concentration and suicidal ideas. The patient is nervous/anxious.        Objective:   Physical Exam Constitutional:      General: He is not in acute distress.    Appearance: Normal appearance. He is well-developed. He is obese. He is not diaphoretic.  HENT:     Head: Normocephalic and atraumatic.     Right Ear: Tympanic membrane, ear canal and external ear normal.     Left Ear: Tympanic membrane, ear canal and  external ear normal.     Nose: Nose normal. No congestion.     Mouth/Throat:     Mouth: Mucous membranes are moist.     Pharynx: Oropharynx is clear. No posterior oropharyngeal erythema.  Eyes:     General: No scleral icterus.       Right eye: No discharge.        Left eye: No discharge.     Conjunctiva/sclera: Conjunctivae normal.     Pupils: Pupils are equal, round, and reactive to light.  Neck:     Thyroid: No thyromegaly.     Vascular: No carotid bruit or JVD.  Cardiovascular:     Rate and Rhythm: Normal rate and regular rhythm.     Pulses: Normal pulses.     Heart sounds: Normal heart sounds.     No gallop.  Pulmonary:     Effort: Pulmonary effort is normal. No respiratory distress.     Breath sounds: Normal breath sounds. No wheezing or rales.     Comments: Good air exch Chest:     Chest wall: No tenderness.  Abdominal:     General: Bowel sounds are normal. There is no distension or abdominal bruit.     Palpations: Abdomen is soft. There is no mass.     Tenderness: There is no abdominal tenderness.     Hernia: No hernia is present.  Musculoskeletal:  General: No tenderness.     Cervical back: Normal range of motion and neck supple. No rigidity. No muscular tenderness.     Right lower leg: No edema.     Left lower leg: No edema.  Lymphadenopathy:     Cervical: No cervical adenopathy.  Skin:    General: Skin is warm and dry.     Coloration: Skin is not pale.     Findings: No erythema or rash.     Comments: Solar lentigines diffusely Recent mole removal mid low back-healing site  Neurological:     Mental Status: He is alert.     Cranial Nerves: No cranial nerve deficit.     Motor: No abnormal muscle tone.     Coordination: Coordination normal.     Gait: Gait normal.     Deep Tendon Reflexes: Reflexes are normal and symmetric. Reflexes normal.  Psychiatric:        Attention and Perception: Attention normal.        Mood and Affect: Mood is anxious and  depressed.        Speech: Speech normal.        Behavior: Behavior normal.        Cognition and Memory: Cognition and memory normal.     Comments: Candidly discusses symptoms and stressors             Assessment & Plan:   Problem List Items Addressed This Visit       Other   Colon cancer screening    Colonoscopy 03/2020 with 5 y recall      Depression with anxiety    Worse lately in setting of wife with etoh/mental health struggles  Reviewed stressors/ coping techniques/symptoms/ support sources/ tx options and side effects in detail today Plans to get counseling-list given to call to get est Disc imp of self care Plan to try a change to different SNRI -pristique 100 mg (from effexor 150) Discussed expectations of SNRI medication including time to effectiveness and mechanism of action, also poss of side effects (early and late)- including mental fuzziness, weight or appetite change, nausea and poss of worse dep or anxiety (even suicidal thoughts)  Pt voiced understanding and will stop med and update if this occurs    Will call with update        Relevant Medications   desvenlafaxine (PRISTIQ) 100 MG 24 hr tablet   Prostate cancer screening    Baseline nocturia is unchanged  psa ordered  Fam h/o cancer in PGF very late in life       Relevant Orders   PSA   Routine general medical examination at a health care facility - Primary    Reviewed health habits including diet and exercise and skin cancer prevention Reviewed appropriate screening tests for age  Also reviewed health mt list, fam hx and immunization status , as well as social and family history   Labs ordered  Discussed shingrix vaccine and he will check on coverage Colonoscopy utd 2022 with 5 y recall  psa ordered/no voiding changes  Treated for OSA/new sleep study pending  PHQ 10- discussed med change and ref to counseling Encouraged exercise and self care      Relevant Orders   CBC with  Differential/Platelet   Comprehensive metabolic panel   Lipid panel   TSH

## 2021-09-17 NOTE — Assessment & Plan Note (Signed)
Worse lately in setting of wife with etoh/mental health struggles  Reviewed stressors/ coping techniques/symptoms/ support sources/ tx options and side effects in detail today Plans to get counseling-list given to call to get est Disc imp of self care Plan to try a change to different SNRI -pristique 100 mg (from effexor 150) Discussed expectations of SNRI medication including time to effectiveness and mechanism of action, also poss of side effects (early and late)- including mental fuzziness, weight or appetite change, nausea and poss of worse dep or anxiety (even suicidal thoughts)  Pt voiced understanding and will stop med and update if this occurs    Will call with update

## 2021-09-17 NOTE — Assessment & Plan Note (Signed)
Colonoscopy 03/2020 with 5 y recall

## 2021-09-18 ENCOUNTER — Other Ambulatory Visit (INDEPENDENT_AMBULATORY_CARE_PROVIDER_SITE_OTHER): Payer: 59

## 2021-09-18 ENCOUNTER — Encounter: Payer: Self-pay | Admitting: Family Medicine

## 2021-09-18 DIAGNOSIS — Z125 Encounter for screening for malignant neoplasm of prostate: Secondary | ICD-10-CM

## 2021-09-18 DIAGNOSIS — Z0000 Encounter for general adult medical examination without abnormal findings: Secondary | ICD-10-CM | POA: Diagnosis not present

## 2021-09-18 LAB — COMPREHENSIVE METABOLIC PANEL
ALT: 20 U/L (ref 0–53)
AST: 23 U/L (ref 0–37)
Albumin: 4.6 g/dL (ref 3.5–5.2)
Alkaline Phosphatase: 52 U/L (ref 39–117)
BUN: 11 mg/dL (ref 6–23)
CO2: 31 mEq/L (ref 19–32)
Calcium: 10 mg/dL (ref 8.4–10.5)
Chloride: 98 mEq/L (ref 96–112)
Creatinine, Ser: 1.03 mg/dL (ref 0.40–1.50)
GFR: 83.65 mL/min (ref >60.00)
Glucose, Bld: 96 mg/dL (ref 70–99)
Potassium: 5.2 mEq/L — ABNORMAL HIGH (ref 3.5–5.1)
Sodium: 135 mEq/L (ref 135–145)
Total Bilirubin: 0.6 mg/dL (ref 0.2–1.2)
Total Protein: 7.1 g/dL (ref 6.0–8.3)

## 2021-09-18 LAB — CBC WITH DIFFERENTIAL/PLATELET
Basophils Absolute: 0.1 10*3/uL (ref 0.0–0.1)
Basophils Relative: 1.1 % (ref 0.0–3.0)
Eosinophils Absolute: 0.7 10*3/uL (ref 0.0–0.7)
Eosinophils Relative: 10.6 % — ABNORMAL HIGH (ref 0.0–5.0)
HCT: 43.2 % (ref 39.0–52.0)
Hemoglobin: 14.6 g/dL (ref 13.0–17.0)
Lymphocytes Relative: 34.3 % (ref 12.0–46.0)
Lymphs Abs: 2.1 10*3/uL (ref 0.7–4.0)
MCHC: 33.7 g/dL (ref 30.0–36.0)
MCV: 93.6 fl (ref 78.0–100.0)
Monocytes Absolute: 0.6 10*3/uL (ref 0.1–1.0)
Monocytes Relative: 10.1 % (ref 3.0–12.0)
Neutro Abs: 2.7 10*3/uL (ref 1.4–7.7)
Neutrophils Relative %: 43.9 % (ref 43.0–77.0)
Platelets: 241 10*3/uL (ref 150.0–400.0)
RBC: 4.62 Mil/uL (ref 4.22–5.81)
RDW: 13 % (ref 11.5–15.5)
WBC: 6.2 10*3/uL (ref 4.0–10.5)

## 2021-09-18 LAB — LIPID PANEL
Cholesterol: 213 mg/dL — ABNORMAL HIGH (ref 0–200)
HDL: 65.9 mg/dL (ref >39.00)
LDL Cholesterol: 133 mg/dL — ABNORMAL HIGH (ref 0–99)
NonHDL: 146.77
Total CHOL/HDL Ratio: 3
Triglycerides: 68 mg/dL (ref 0.0–149.0)
VLDL: 13.6 mg/dL (ref 0.0–40.0)

## 2021-09-18 LAB — TSH: TSH: 1.7 u[IU]/mL (ref 0.35–5.50)

## 2021-09-18 LAB — PSA: PSA: 3.11 ng/mL (ref 0.10–4.00)

## 2021-09-20 ENCOUNTER — Encounter: Payer: Self-pay | Admitting: Family Medicine

## 2021-09-20 DIAGNOSIS — F418 Other specified anxiety disorders: Secondary | ICD-10-CM

## 2021-09-23 ENCOUNTER — Other Ambulatory Visit: Payer: Self-pay | Admitting: Family Medicine

## 2021-09-24 ENCOUNTER — Encounter: Payer: Self-pay | Admitting: Family Medicine

## 2021-09-24 ENCOUNTER — Telehealth: Payer: Self-pay | Admitting: Family Medicine

## 2021-09-24 ENCOUNTER — Other Ambulatory Visit (HOSPITAL_COMMUNITY): Payer: Self-pay

## 2021-09-24 DIAGNOSIS — E875 Hyperkalemia: Secondary | ICD-10-CM

## 2021-09-24 MED ORDER — VALACYCLOVIR HCL 1 G PO TABS
ORAL_TABLET | ORAL | 5 refills | Status: DC
  Filled 2021-09-24: qty 4, 1d supply, fill #0
  Filled 2021-12-17: qty 4, 1d supply, fill #1
  Filled 2022-02-25: qty 4, 1d supply, fill #2
  Filled 2022-07-16: qty 4, 1d supply, fill #3

## 2021-09-24 NOTE — Telephone Encounter (Signed)
-----   Message from Ellamae Sia sent at 09/20/2021  2:13 PM EDT ----- Regarding: Lab orders for Wendesday, 8.2.23 Lab orders for repeat labs

## 2021-09-25 ENCOUNTER — Other Ambulatory Visit (INDEPENDENT_AMBULATORY_CARE_PROVIDER_SITE_OTHER): Payer: 59

## 2021-09-25 DIAGNOSIS — E875 Hyperkalemia: Secondary | ICD-10-CM | POA: Diagnosis not present

## 2021-09-25 LAB — BASIC METABOLIC PANEL
BUN: 12 mg/dL (ref 6–23)
CO2: 30 mEq/L (ref 19–32)
Calcium: 9.3 mg/dL (ref 8.4–10.5)
Chloride: 101 mEq/L (ref 96–112)
Creatinine, Ser: 1.01 mg/dL (ref 0.40–1.50)
GFR: 85.63 mL/min (ref >60.00)
Glucose, Bld: 95 mg/dL (ref 70–99)
Potassium: 4.4 mEq/L (ref 3.5–5.1)
Sodium: 138 mEq/L (ref 135–145)

## 2021-09-27 ENCOUNTER — Other Ambulatory Visit (HOSPITAL_COMMUNITY): Payer: Self-pay

## 2021-09-27 MED ORDER — ESCITALOPRAM OXALATE 20 MG PO TABS
20.0000 mg | ORAL_TABLET | Freq: Every day | ORAL | 3 refills | Status: DC
  Filled 2021-09-27: qty 90, 90d supply, fill #0
  Filled 2022-02-25: qty 90, 90d supply, fill #1
  Filled 2022-05-22: qty 90, 90d supply, fill #2
  Filled 2022-09-10: qty 90, 90d supply, fill #3

## 2021-09-27 NOTE — Addendum Note (Signed)
Addended by: Loura Pardon A on: 09/27/2021 12:31 PM   Modules accepted: Orders

## 2021-10-09 ENCOUNTER — Other Ambulatory Visit (HOSPITAL_COMMUNITY): Payer: Self-pay

## 2021-10-14 ENCOUNTER — Ambulatory Visit: Payer: 59 | Admitting: Family Medicine

## 2021-10-14 ENCOUNTER — Telehealth: Payer: 59 | Admitting: Physician Assistant

## 2021-10-14 ENCOUNTER — Other Ambulatory Visit (HOSPITAL_COMMUNITY): Payer: Self-pay

## 2021-10-14 DIAGNOSIS — J019 Acute sinusitis, unspecified: Secondary | ICD-10-CM | POA: Diagnosis not present

## 2021-10-14 DIAGNOSIS — J208 Acute bronchitis due to other specified organisms: Secondary | ICD-10-CM | POA: Diagnosis not present

## 2021-10-14 DIAGNOSIS — B9689 Other specified bacterial agents as the cause of diseases classified elsewhere: Secondary | ICD-10-CM | POA: Diagnosis not present

## 2021-10-14 MED ORDER — AMOXICILLIN-POT CLAVULANATE 875-125 MG PO TABS
1.0000 | ORAL_TABLET | Freq: Two times a day (BID) | ORAL | 0 refills | Status: DC
  Filled 2021-10-14: qty 14, 7d supply, fill #0

## 2021-10-14 NOTE — Progress Notes (Signed)
Virtual Visit Consent   Eric Blackburn, you are scheduled for a virtual visit with a [facility] provider today. Just as with appointments in the office, your consent must be obtained to participate. Your consent will be active for this visit and any virtual visit you may have with one of our providers in the next 365 days. If you have a MyChart account, a copy of this consent can be sent to you electronically.  As this is a virtual visit, video technology does not allow for your provider to perform a traditional examination. This may limit your provider's ability to fully assess your condition. If your provider identifies any concerns that need to be evaluated in person or the need to arrange testing (such as labs, EKG, etc.), we will make arrangements to do so. Although advances in technology are sophisticated, we cannot ensure that it will always work on either your end or our end. If the connection with a video visit is poor, the visit may have to be switched to a telephone visit. With either a video or telephone visit, we are not always able to ensure that we have a secure connection.  By engaging in this virtual visit, you consent to the provision of healthcare and authorize for your insurance to be billed (if applicable) for the services provided during this visit. Depending on your insurance coverage, you may receive a charge related to this service.  I need to obtain your verbal consent now. Are you willing to proceed with your visit today? Eric Blackburn has provided verbal consent on 10/14/2021 for a virtual visit (video or telephone). Mar Daring, PA-C  Date: 10/14/2021 9:18 AM  Virtual Visit via Video Note   I, Mar Daring, connected with  Eric Blackburn  ([id number], 23-Dec-1970) on 10/14/21 at  9:15 AM EDT by a video-enabled telemedicine application and verified that I am speaking with the correct person using two identifiers.  Location: Patient: Virtual Visit  Location Patient: Home Provider: Virtual Visit Location Provider: Home Office   I discussed the limitations of evaluation and management by telemedicine and the availability of in person appointments. The patient expressed understanding and agreed to proceed.    History of Present Illness: Eric Blackburn is a 51 y.o. who identifies as a male who was assigned male at birth, and is being seen today for URI Symptoms.  HPI: URI  This is a new problem. The current episode started 1 to 4 weeks ago (Started Sunday 10/06/21). The problem has been gradually worsening. There has been no fever. Associated symptoms include congestion, coughing (productive), headaches, a plugged ear sensation, rhinorrhea and sinus pain. Pertinent negatives include no diarrhea, ear pain, nausea, sore throat or vomiting. He has tried acetaminophen, antihistamine, decongestant, NSAIDs and increased fluids (saline rinses) for the symptoms. The treatment provided no relief.      Problems:  Patient Active Problem List   Diagnosis Date Noted   Hx of sessile serrated colonic polyps 04/10/2020   Colon cancer screening 01/16/2020   Depressed mood 04/04/2019   Shoulder pain 02/22/2019   Hyperkalemia 12/24/2016   Prostate cancer screening 09/10/2015   Rhinitis, nonallergic 04/16/2015   Obstructive sleep apnea 05/25/2013   Routine general medical examination at a health care facility 05/16/2013   Back pain 02/29/2008   Depression with anxiety 03/31/2007   HYPOSPADIAS 03/31/2007    Allergies: No Known Allergies Medications:  Current Outpatient Medications:    amoxicillin-clavulanate (AUGMENTIN) 875-125 MG tablet, Take 1  tablet by mouth 2 (two) times daily., Disp: 14 tablet, Rfl: 0   buPROPion (WELLBUTRIN XL) 300 MG 24 hr tablet, TAKE 1 TABLET BY MOUTH ONCE A DAY, Disp: 90 tablet, Rfl: 0   escitalopram (LEXAPRO) 20 MG tablet, Take 1 tablet (20 mg total) by mouth daily., Disp: 90 tablet, Rfl: 3   ibuprofen (ADVIL) 200 MG  tablet, Take 400-800 mg by mouth every 6 (six) hours as needed (pain)., Disp: , Rfl:    melatonin 5 MG TABS, Take 1 tablet (5 mg total) by mouth at bedtime as needed., Disp: 30 tablet, Rfl: 12   Multiple Vitamin (MULTIVITAMIN) capsule, Take 1 capsule by mouth daily., Disp: , Rfl:    Omega-3 Fatty Acids (FISH OIL) 1000 MG CAPS, Take 1 capsule by mouth 2 (two) times daily., Disp: , Rfl:    oxymetazoline (AFRIN) 0.05 % nasal spray, Place 1 spray into both nostrils at bedtime. Alternate nostril, Disp: , Rfl:    Turmeric (QC TUMERIC COMPLEX PO), Take 500 mg by mouth daily., Disp: , Rfl:    valACYclovir (VALTREX) 1000 MG tablet, TAKE 2 TABLETS BY MOUTH TWO TIMES A DAY FOR 1 DAY FOR COLD SORE AS NEEDED., Disp: 4 tablet, Rfl: 5  Observations/Objective: Patient is well-developed, well-nourished in no acute distress.  Resting comfortably at home.  Head is normocephalic, atraumatic.  No labored breathing.  Congested voice and sniffling with nasal drainage Speech is clear and coherent with logical content.  Patient is alert and oriented at baseline.    Assessment and Plan: 1. Acute bacterial sinusitis - amoxicillin-clavulanate (AUGMENTIN) 875-125 MG tablet; Take 1 tablet by mouth 2 (two) times daily.  Dispense: 14 tablet; Refill: 0  2. Acute bacterial bronchitis - amoxicillin-clavulanate (AUGMENTIN) 875-125 MG tablet; Take 1 tablet by mouth 2 (two) times daily.  Dispense: 14 tablet; Refill: 0  - Worsening symptoms that have not responded to OTC medications.  - Will give Augmentin - Continue allergy medications.  - Steam and humidifier can help - Stay well hydrated and get plenty of rest.  - Seek in person evaluation if no symptom improvement or if symptoms worsen   Follow Up Instructions: I discussed the assessment and treatment plan with the patient. The patient was provided an opportunity to ask questions and all were answered. The patient agreed with the plan and demonstrated an  understanding of the instructions.  A copy of instructions were sent to the patient via MyChart unless otherwise noted below.    The patient was advised to call back or seek an in-person evaluation if the symptoms worsen or if the condition fails to improve as anticipated.  Time:  I spent 8 minutes with the patient via telehealth technology discussing the above problems/concerns.    Mar Daring, PA-C

## 2021-10-14 NOTE — Patient Instructions (Signed)
Eric Blackburn, thank you for joining Mar Daring, PA-C for today's virtual visit.  While this provider is not your primary care provider (PCP), if your PCP is located in our provider database this encounter information will be shared with them immediately following your visit.  Consent: (Patient) Eric Blackburn provided verbal consent for this virtual visit at the beginning of the encounter.  Current Medications:  Current Outpatient Medications:    amoxicillin-clavulanate (AUGMENTIN) 875-125 MG tablet, Take 1 tablet by mouth 2 (two) times daily., Disp: 14 tablet, Rfl: 0   buPROPion (WELLBUTRIN XL) 300 MG 24 hr tablet, TAKE 1 TABLET BY MOUTH ONCE A DAY, Disp: 90 tablet, Rfl: 0   escitalopram (LEXAPRO) 20 MG tablet, Take 1 tablet (20 mg total) by mouth daily., Disp: 90 tablet, Rfl: 3   ibuprofen (ADVIL) 200 MG tablet, Take 400-800 mg by mouth every 6 (six) hours as needed (pain)., Disp: , Rfl:    melatonin 5 MG TABS, Take 1 tablet (5 mg total) by mouth at bedtime as needed., Disp: 30 tablet, Rfl: 12   Multiple Vitamin (MULTIVITAMIN) capsule, Take 1 capsule by mouth daily., Disp: , Rfl:    Omega-3 Fatty Acids (FISH OIL) 1000 MG CAPS, Take 1 capsule by mouth 2 (two) times daily., Disp: , Rfl:    oxymetazoline (AFRIN) 0.05 % nasal spray, Place 1 spray into both nostrils at bedtime. Alternate nostril, Disp: , Rfl:    Turmeric (QC TUMERIC COMPLEX PO), Take 500 mg by mouth daily., Disp: , Rfl:    valACYclovir (VALTREX) 1000 MG tablet, TAKE 2 TABLETS BY MOUTH TWO TIMES A DAY FOR 1 DAY FOR COLD SORE AS NEEDED., Disp: 4 tablet, Rfl: 5   Medications ordered in this encounter:  Meds ordered this encounter  Medications   amoxicillin-clavulanate (AUGMENTIN) 875-125 MG tablet    Sig: Take 1 tablet by mouth 2 (two) times daily.    Dispense:  14 tablet    Refill:  0    Order Specific Question:   Supervising Provider    Answer:   Sabra Heck, BRIAN [[id number]]     *If you need refills on other  medications prior to your next appointment, please contact your pharmacy*  Follow-Up: Call back or seek an in-person evaluation if the symptoms worsen or if the condition fails to improve as anticipated.  Other Instructions Sinus Infection, Adult A sinus infection, also called sinusitis, is inflammation of your sinuses. Sinuses are hollow spaces in the bones around your face. Your sinuses are located: Around your eyes. In the middle of your forehead. Behind your nose. In your cheekbones. Mucus normally drains out of your sinuses. When your nasal tissues become inflamed or swollen, mucus can become trapped or blocked. This allows bacteria, viruses, and fungi to grow, which leads to infection. Most infections of the sinuses are caused by a virus. A sinus infection can develop quickly. It can last for up to 4 weeks (acute) or for more than 12 weeks (chronic). A sinus infection often develops after a cold. What are the causes? This condition is caused by anything that creates swelling in the sinuses or stops mucus from draining. This includes: Allergies. Asthma. Infection from bacteria or viruses. Deformities or blockages in your nose or sinuses. Abnormal growths in the nose (nasal polyps). Pollutants, such as chemicals or irritants in the air. Infection from fungi. This is rare. What increases the risk? You are more likely to develop this condition if you: Have a weak body defense system (  immune system). Do a lot of swimming or diving. Overuse nasal sprays. Smoke. What are the signs or symptoms? The main symptoms of this condition are pain and a feeling of pressure around the affected sinuses. Other symptoms include: Stuffy nose or congestion that makes it difficult to breathe through your nose. Thick yellow or greenish drainage from your nose. Tenderness, swelling, and warmth over the affected sinuses. A cough that may get worse at night. Decreased sense of smell and taste. Extra  mucus that collects in the throat or the back of the nose (postnasal drip) causing a sore throat or bad breath. Tiredness (fatigue). Fever. How is this diagnosed? This condition is diagnosed based on: Your symptoms. Your medical history. A physical exam. Tests to find out if your condition is acute or chronic. This may include: Checking your nose for nasal polyps. Viewing your sinuses using a device that has a light (endoscope). Testing for allergies or bacteria. Imaging tests, such as an MRI or CT scan. In rare cases, a bone biopsy may be done to rule out more serious types of fungal sinus disease. How is this treated? Treatment for a sinus infection depends on the cause and whether your condition is chronic or acute. If caused by a virus, your symptoms should go away on their own within 10 days. You may be given medicines to relieve symptoms. They include: Medicines that shrink swollen nasal passages (decongestants). A spray that eases inflammation of the nostrils (topical intranasal corticosteroids). Rinses that help get rid of thick mucus in your nose (nasal saline washes). Medicines that treat allergies (antihistamines). Over-the-counter pain relievers. If caused by bacteria, your health care provider may recommend waiting to see if your symptoms improve. Most bacterial infections will get better without antibiotic medicine. You may be given antibiotics if you have: A severe infection. A weak immune system. If caused by narrow nasal passages or nasal polyps, surgery may be needed. Follow these instructions at home: Medicines Take, use, or apply over-the-counter and prescription medicines only as told by your health care provider. These may include nasal sprays. If you were prescribed an antibiotic medicine, take it as told by your health care provider. Do not stop taking the antibiotic even if you start to feel better. Hydrate and humidify  Drink enough fluid to keep your urine  pale yellow. Staying hydrated will help to thin your mucus. Use a cool mist humidifier to keep the humidity level in your home above 50%. Inhale steam for 10-15 minutes, 3-4 times a day, or as told by your health care provider. You can do this in the bathroom while a hot shower is running. Limit your exposure to cool or dry air. Rest Rest as much as possible. Sleep with your head raised (elevated). Make sure you get enough sleep each night. General instructions  Apply a warm, moist washcloth to your face 3-4 times a day or as told by your health care provider. This will help with discomfort. Use nasal saline washes as often as told by your health care provider. Wash your hands often with soap and water to reduce your exposure to germs. If soap and water are not available, use hand sanitizer. Do not smoke. Avoid being around people who are smoking (secondhand smoke). Keep all follow-up visits. This is important. Contact a health care provider if: You have a fever. Your symptoms get worse. Your symptoms do not improve within 10 days. Get help right away if: You have a severe headache. You have  persistent vomiting. You have severe pain or swelling around your face or eyes. You have vision problems. You develop confusion. Your neck is stiff. You have trouble breathing. These symptoms may be an emergency. Get help right away. Call 911. Do not wait to see if the symptoms will go away. Do not drive yourself to the hospital. Summary A sinus infection is soreness and inflammation of your sinuses. Sinuses are hollow spaces in the bones around your face. This condition is caused by nasal tissues that become inflamed or swollen. The swelling traps or blocks the flow of mucus. This allows bacteria, viruses, and fungi to grow, which leads to infection. If you were prescribed an antibiotic medicine, take it as told by your health care provider. Do not stop taking the antibiotic even if you start to  feel better. Keep all follow-up visits. This is important. This information is not intended to replace advice given to you by your health care provider. Make sure you discuss any questions you have with your health care provider. Document Revised: 01/15/2021 Document Reviewed: 01/15/2021 Elsevier Patient Education  [place].    If you have been instructed to have an in-person evaluation today at a local Urgent Care facility, please use the link below. It will take you to a list of all of our available [facility], including address, phone number and hours of operation. Please do not delay care.  [facility]  If you or a family member do not have a primary care provider, use the link below to schedule a visit and establish care. When you choose a [facility] primary care physician or advanced practice provider, you gain a long-term partner in health. Find a Primary Care Provider  Learn more about [facility]'s in-office and virtual care options: [facility]

## 2021-10-21 ENCOUNTER — Encounter: Payer: Self-pay | Admitting: Internal Medicine

## 2021-10-21 ENCOUNTER — Ambulatory Visit: Payer: 59 | Admitting: Internal Medicine

## 2021-10-21 DIAGNOSIS — J0110 Acute frontal sinusitis, unspecified: Secondary | ICD-10-CM | POA: Diagnosis not present

## 2021-10-21 MED ORDER — DOXYCYCLINE HYCLATE 100 MG PO TABS: 100.0000 mg | ORAL_TABLET | Freq: Two times a day (BID) | ORAL | 0 refills | Status: DC

## 2021-10-21 MED ORDER — PREDNISONE 20 MG PO TABS: 40.0000 mg | ORAL_TABLET | Freq: Every day | ORAL | 0 refills | Status: DC

## 2021-10-21 NOTE — Assessment & Plan Note (Signed)
Persists despite the augmentin ?Mycoplasma Resistance??  Will change to doxy 100 bid x 7 days 3 days of prednisone '40mg'$  daily

## 2021-10-21 NOTE — Progress Notes (Signed)
Subjective:    Patient ID: Eric Blackburn, male    DOB: November 19, 1970, 51 y.o.   MRN: [id number]  HPI Here due to ongoing respiratory symptoms  Sick for 2 weeks---wife with similar symptoms Had video visit last week Started with head cold--progressed with more cough, sputum Lots of post nasal drip Now feels it in his airway---not really SOB but breathing is affected No fever Not much better with the augmentin---still with drainage and symptoms No chills or sweats  COVID test negative x 3 Missed 1 day of work over the 2 weeks--can work from home  Current Outpatient Medications on File Prior to Visit  Medication Sig Dispense Refill   buPROPion (WELLBUTRIN XL) 300 MG 24 hr tablet TAKE 1 TABLET BY MOUTH ONCE A DAY 90 tablet 0   escitalopram (LEXAPRO) 20 MG tablet Take 1 tablet (20 mg total) by mouth daily. 90 tablet 3   ibuprofen (ADVIL) 200 MG tablet Take 400-800 mg by mouth every 6 (six) hours as needed (pain).     melatonin 5 MG TABS Take 1 tablet (5 mg total) by mouth at bedtime as needed. 30 tablet 12   Multiple Vitamin (MULTIVITAMIN) capsule Take 1 capsule by mouth daily.     Omega-3 Fatty Acids (FISH OIL) 1000 MG CAPS Take 1 capsule by mouth 2 (two) times daily.     oxymetazoline (AFRIN) 0.05 % nasal spray Place 1 spray into both nostrils at bedtime. Alternate nostril     Turmeric (QC TUMERIC COMPLEX PO) Take 500 mg by mouth daily.     valACYclovir (VALTREX) 1000 MG tablet TAKE 2 TABLETS BY MOUTH TWO TIMES A DAY FOR 1 DAY FOR COLD SORE AS NEEDED. 4 tablet 5   No current facility-administered medications on file prior to visit.    No Known Allergies  Past Medical History:  Diagnosis Date   Anxiety    on meds   Depression    on meds   Fever blister    on meds   Hx of sessile serrated colonic polyps 04/10/2020   Sleep apnea    uses CPAP    Past Surgical History:  Procedure Laterality Date   ANTERIOR CRUCIATE LIGAMENT REPAIR Right 1992   HERNIA REPAIR Bilateral     x 2 as a child    Family History  Problem Relation Age of Onset   Depression Mother    Diabetes type II Mother    Pulmonary Hypertension Mother    Stroke Sister 94       hemorrhagic post procedure   Anxiety disorder Sister    Depression Sister    Asthma Sister        developed as a child   Lung cancer Sister 69       small cell   Emphysema Maternal Grandfather    Prostate cancer Maternal Grandfather    Lymphoma Paternal Grandmother    Colon polyps Neg Hx    Colon cancer Neg Hx    Esophageal cancer Neg Hx    Stomach cancer Neg Hx    Rectal cancer Neg Hx     Social History   Socioeconomic History   Marital status: Married    Spouse name: Not on file   Number of children: 2   Years of education: Not on file   Highest education level: Not on file  Occupational History   Occupation: pharmacist    Employer: [employer]  Tobacco Use   Smoking status: Never    Passive  exposure: Past   Smokeless tobacco: Never  Vaping Use   Vaping Use: Never used  Substance and Sexual Activity   Alcohol use: Yes    Alcohol/week: 20.0 standard drinks of alcohol    Types: 20 Standard drinks or equivalent per week    Comment: 1-4 drinks daily (beer)   Drug use: No   Sexual activity: Not on file  Other Topics Concern   Not on file  Social History Narrative   Not on file   Social Determinants of Health   Financial Resource Strain: Not on file  Food Insecurity: Not on file  Transportation Needs: Not on file  Physical Activity: Not on file  Stress: Not on file  Social Connections: Not on file  Intimate Partner Violence: Not on file   Review of Systems No rash No N/V Eating okay    Objective:   Physical Exam Constitutional:      Appearance: Normal appearance.  HENT:     Head:     Comments: No sinus tenderness    Right Ear: Tympanic membrane and ear canal normal.     Left Ear: Tympanic membrane and ear canal normal.     Nose:     Comments: Moderate inflammation     Mouth/Throat:     Pharynx: No oropharyngeal exudate or posterior oropharyngeal erythema.  Pulmonary:     Effort: Pulmonary effort is normal.     Breath sounds: Normal breath sounds. No wheezing or rales.  Musculoskeletal:     Cervical back: Neck supple.  Lymphadenopathy:     Cervical: No cervical adenopathy.  Neurological:     Mental Status: He is alert.            Assessment & Plan:

## 2021-11-01 ENCOUNTER — Encounter (HOSPITAL_BASED_OUTPATIENT_CLINIC_OR_DEPARTMENT_OTHER): Payer: 59 | Admitting: Internal Medicine

## 2021-11-02 ENCOUNTER — Other Ambulatory Visit: Payer: Self-pay | Admitting: Family Medicine

## 2021-11-04 ENCOUNTER — Ambulatory Visit (HOSPITAL_BASED_OUTPATIENT_CLINIC_OR_DEPARTMENT_OTHER): Payer: 59 | Attending: Internal Medicine | Admitting: Internal Medicine

## 2021-11-04 VITALS — Ht 71.0 in | Wt 215.0 lb

## 2021-11-04 DIAGNOSIS — G4733 Obstructive sleep apnea (adult) (pediatric): Secondary | ICD-10-CM | POA: Diagnosis not present

## 2021-11-05 ENCOUNTER — Other Ambulatory Visit (HOSPITAL_COMMUNITY): Payer: Self-pay

## 2021-11-05 MED ORDER — BUPROPION HCL ER (XL) 300 MG PO TB24
ORAL_TABLET | Freq: Every day | ORAL | 2 refills | Status: DC
  Filled 2021-11-05: qty 90, 90d supply, fill #0
  Filled 2022-01-31: qty 90, 90d supply, fill #1
  Filled 2022-05-01: qty 90, 90d supply, fill #2

## 2021-11-06 ENCOUNTER — Other Ambulatory Visit (HOSPITAL_COMMUNITY): Payer: Self-pay

## 2021-11-09 DIAGNOSIS — G4733 Obstructive sleep apnea (adult) (pediatric): Secondary | ICD-10-CM

## 2021-11-09 NOTE — Procedures (Signed)
     Patient Name: Eric Blackburn, Durio Date: 11/04/2021 Gender: Male D.O.B: 04/25/1970 Age (years): 46 Referring Provider: Baird Lyons MD, ABSM Height (inches): 71 Interpreting Physician: Baird Lyons MD, ABSM Weight (lbs): 215 RPSGT: Zadie Rhine BMI: 30 MRN: [id number] Neck Size: 16.50  CLINICAL INFORMATION Sleep Study Type: NPSG Indication for sleep study: Snoring Epworth Sleepiness Score: 4  SLEEP STUDY TECHNIQUE As per the AASM Manual for the Scoring of Sleep and Associated Events v2.3 (April 2016) with a hypopnea requiring 4% desaturations.  The channels recorded and monitored were frontal, central and occipital EEG, electrooculogram (EOG), submentalis EMG (chin), nasal and oral airflow, thoracic and abdominal wall motion, anterior tibialis EMG, snore microphone, electrocardiogram, and pulse oximetry.  MEDICATIONS Medications self-administered by patient taken the night of the study : [place] The study was initiated at 9:55:16 PM and ended at 4:15:09 AM.  Sleep onset time was 6.6 minutes and the sleep efficiency was 86.1%. The total sleep time was 327 minutes.  Stage REM latency was N/A minutes.  The patient spent 26.91% of the night in stage N1 sleep, 70.18% in stage N2 sleep, 2.91% in stage N3 and 0% in REM.  Alpha intrusion was absent.  Supine sleep was 84.71%.  RESPIRATORY PARAMETERS The overall apnea/hypopnea index (AHI) was 55.8 per hour. There were 18 total apneas, including 17 obstructive, 1 central and 0 mixed apneas. There were 286 hypopneas and 27 RERAs.  The AHI during Stage REM sleep was N/A per hour.  AHI while supine was 65.8 per hour.  The mean oxygen saturation was 88.97%. The minimum SpO2 during sleep was 78.00%.  soft snoring was noted during this study.  CARDIAC DATA The 2 lead EKG demonstrated sinus rhythm. The mean heart rate was 62.76 beats per minute. Other EKG findings include: None.  LEG MOVEMENT  DATA The total PLMS were 0 with a resulting PLMS index of 0.00. Associated arousal with leg movement index was 0.0 .  IMPRESSIONS - Severe obstructive sleep apnea occurred during this study (AHI = 55.8/h). - No significant central sleep apnea occurred during this study (CAI = 0.2/h). - Moderate oxygen desaturation was noted during this study (Min O2 = 78.00%). Mean 89%. Time with O2 saturation 88% or less was 120 minutes. - The patient snored with soft snoring volume. - No cardiac abnormalities were noted during this study. - Clinically significant periodic limb movements did not occur during sleep. No significant associated arousals.  DIAGNOSIS - Obstructive Sleep Apnea (G47.33) - Nocturnal Hypoxemia (G47.36)  RECOMMENDATIONS - CPAP remains best option for most patients with scores in this range. Other options would be based on clinical judgment. - Be careful with alcohol, sedatives and other CNS depressants that may worsen sleep apnea and disrupt normal sleep architecture. - Sleep hygiene should be reviewed to assess factors that may improve sleep quality. - Weight management and regular exercise should be initiated or continued if appropriate.  [Electronically signed] 11/09/2021 12:32 PM  Baird Lyons MD, [place], American Board of Sleep Medicine NPI: [id number]                        [place], [place] of Sleep [facility]  ELECTRONICALLY SIGNED ON:  11/09/2021, 12:29 PM [place] PH: [phone]   FX: [phone] [place]

## 2021-11-19 DIAGNOSIS — F331 Major depressive disorder, recurrent, moderate: Secondary | ICD-10-CM | POA: Diagnosis not present

## 2021-11-20 ENCOUNTER — Encounter: Payer: Self-pay | Admitting: Internal Medicine

## 2021-11-21 NOTE — Telephone Encounter (Signed)
Sleep study showed severe obstructive sleep apnea, averaging 55 apneas/ hour with oxygen desaturation as low as 78%. CPAP is still the most widely used treatment for scores in this range.  Sleep study results do not migrate into My Chart. There is an order in place for referral to ENT to discuss your stuffy nose and also [facility] surgery.  Let us know if you haven't been contacted yet about this appointment,

## 2021-11-21 NOTE — Telephone Encounter (Signed)
Mychart message sent by pt: Eric Blackburn [facility] (supporting Deneise Lever, MD) Yesterday (1:02 PM)    My sleep study from more than 2 weeks ago is still not resulted. Is this normal? Will the result be visible to me in MyChart?    Dr. Annamaria Boots, please advise on this for pt.

## 2021-11-28 DIAGNOSIS — F331 Major depressive disorder, recurrent, moderate: Secondary | ICD-10-CM | POA: Diagnosis not present

## 2021-12-04 DIAGNOSIS — F331 Major depressive disorder, recurrent, moderate: Secondary | ICD-10-CM | POA: Diagnosis not present

## 2021-12-05 ENCOUNTER — Other Ambulatory Visit: Payer: Self-pay

## 2021-12-05 DIAGNOSIS — G4733 Obstructive sleep apnea (adult) (pediatric): Secondary | ICD-10-CM | POA: Diagnosis not present

## 2021-12-05 MED ORDER — COMIRNATY 30 MCG/0.3ML IM SUSP
0.3000 mL | INTRAMUSCULAR | 0 refills | Status: DC
  Filled 2021-12-05: qty 0.3, 1d supply, fill #0

## 2021-12-18 ENCOUNTER — Other Ambulatory Visit (HOSPITAL_COMMUNITY): Payer: Self-pay

## 2021-12-20 DIAGNOSIS — F331 Major depressive disorder, recurrent, moderate: Secondary | ICD-10-CM | POA: Diagnosis not present

## 2021-12-24 DIAGNOSIS — G4733 Obstructive sleep apnea (adult) (pediatric): Secondary | ICD-10-CM | POA: Diagnosis not present

## 2021-12-24 DIAGNOSIS — Z6830 Body mass index (BMI) 30.0-30.9, adult: Secondary | ICD-10-CM | POA: Diagnosis not present

## 2021-12-25 ENCOUNTER — Other Ambulatory Visit: Payer: Self-pay | Admitting: Otolaryngology

## 2021-12-26 DIAGNOSIS — F331 Major depressive disorder, recurrent, moderate: Secondary | ICD-10-CM | POA: Diagnosis not present

## 2021-12-30 ENCOUNTER — Other Ambulatory Visit: Payer: Self-pay

## 2021-12-30 ENCOUNTER — Encounter (HOSPITAL_BASED_OUTPATIENT_CLINIC_OR_DEPARTMENT_OTHER): Payer: Self-pay | Admitting: Otolaryngology

## 2022-01-01 DIAGNOSIS — F331 Major depressive disorder, recurrent, moderate: Secondary | ICD-10-CM | POA: Diagnosis not present

## 2022-01-02 DIAGNOSIS — G4733 Obstructive sleep apnea (adult) (pediatric): Secondary | ICD-10-CM | POA: Diagnosis not present

## 2022-01-07 NOTE — Anesthesia Preprocedure Evaluation (Signed)
Anesthesia Evaluation  Patient identified by MRN, date of birth, ID band Patient awake    Reviewed: Allergy & Precautions, NPO status , Patient's Chart, lab work & pertinent test results  History of Anesthesia Complications Negative for: history of anesthetic complications  Airway Mallampati: II  TM Distance: >3 FB Neck ROM: Full    Dental no notable dental hx.    Pulmonary sleep apnea and Continuous Positive Airway Pressure Ventilation    Pulmonary exam normal        Cardiovascular negative cardio ROS Normal cardiovascular exam     Neuro/Psych   Anxiety Depression    negative neurological ROS     GI/Hepatic negative GI ROS, Neg liver ROS,,,  Endo/Other  negative endocrine ROS    Renal/GU negative Renal ROS  negative genitourinary   Musculoskeletal negative musculoskeletal ROS (+)    Abdominal   Peds  Hematology negative hematology ROS (+)   Anesthesia Other Findings Day of surgery medications reviewed with patient.  Reproductive/Obstetrics negative OB ROS                              Anesthesia Physical Anesthesia Plan  ASA: 2  Anesthesia Plan: MAC   Post-op Pain Management: Minimal or no pain anticipated   Induction:   PONV Risk Score and Plan: 1 and Treatment may vary due to age or medical condition and Propofol infusion  Airway Management Planned: Natural Airway  Additional Equipment: None  Intra-op Plan:   Post-operative Plan:   Informed Consent: I have reviewed the patients History and Physical, chart, labs and discussed the procedure including the risks, benefits and alternatives for the proposed anesthesia with the patient or authorized representative who has indicated his/her understanding and acceptance.       Plan Discussed with: CRNA  Anesthesia Plan Comments:         Anesthesia Quick Evaluation

## 2022-01-08 ENCOUNTER — Ambulatory Visit (HOSPITAL_BASED_OUTPATIENT_CLINIC_OR_DEPARTMENT_OTHER): Payer: 59 | Admitting: Anesthesiology

## 2022-01-08 ENCOUNTER — Ambulatory Visit (HOSPITAL_BASED_OUTPATIENT_CLINIC_OR_DEPARTMENT_OTHER)
Admission: RE | Admit: 2022-01-08 | Discharge: 2022-01-08 | Disposition: A | Discharge status: Home / Self Care | Payer: 59 | Attending: Otolaryngology | Admitting: Otolaryngology

## 2022-01-08 ENCOUNTER — Encounter (HOSPITAL_BASED_OUTPATIENT_CLINIC_OR_DEPARTMENT_OTHER)
Admission: RE | Disposition: A | Discharge status: Home / Self Care | Payer: Self-pay | Source: Home / Self Care | Attending: Otolaryngology

## 2022-01-08 ENCOUNTER — Encounter (HOSPITAL_BASED_OUTPATIENT_CLINIC_OR_DEPARTMENT_OTHER): Payer: Self-pay | Admitting: Otolaryngology

## 2022-01-08 DIAGNOSIS — F32A Depression, unspecified: Secondary | ICD-10-CM | POA: Diagnosis not present

## 2022-01-08 DIAGNOSIS — G4733 Obstructive sleep apnea (adult) (pediatric): Secondary | ICD-10-CM | POA: Diagnosis not present

## 2022-01-08 DIAGNOSIS — Z992 Dependence on renal dialysis: Secondary | ICD-10-CM

## 2022-01-08 DIAGNOSIS — Z6830 Body mass index (BMI) 30.0-30.9, adult: Secondary | ICD-10-CM | POA: Insufficient documentation

## 2022-01-08 DIAGNOSIS — F418 Other specified anxiety disorders: Secondary | ICD-10-CM | POA: Diagnosis not present

## 2022-01-08 DIAGNOSIS — F419 Anxiety disorder, unspecified: Secondary | ICD-10-CM | POA: Insufficient documentation

## 2022-01-08 DIAGNOSIS — Z01818 Encounter for other preprocedural examination: Secondary | ICD-10-CM

## 2022-01-08 DIAGNOSIS — Z9989 Dependence on other enabling machines and devices: Secondary | ICD-10-CM | POA: Diagnosis not present

## 2022-01-08 HISTORY — PX: DRUG INDUCED ENDOSCOPY: SHX6808

## 2022-01-08 SURGERY — DRUG INDUCED SLEEP ENDOSCOPY
Anesthesia: Monitor Anesthesia Care | Site: Nose

## 2022-01-08 MED ORDER — OXYMETAZOLINE HCL 0.05 % NA SOLN
NASAL | Status: DC | PRN
  Administered 2022-01-08: 1 via TOPICAL

## 2022-01-08 MED ORDER — PHENYLEPHRINE HCL (PRESSORS) 10 MG/ML IV SOLN
INTRAVENOUS | Status: AC
  Filled 2022-01-08: qty 1

## 2022-01-08 MED ORDER — PROPOFOL 500 MG/50ML IV EMUL
INTRAVENOUS | Status: DC | PRN
  Administered 2022-01-08: 35 ug/kg/min via INTRAVENOUS

## 2022-01-08 MED ORDER — ONDANSETRON HCL 4 MG/2ML IJ SOLN
INTRAMUSCULAR | Status: AC
  Filled 2022-01-08: qty 24

## 2022-01-08 MED ORDER — DEXAMETHASONE SODIUM PHOSPHATE 10 MG/ML IJ SOLN
INTRAMUSCULAR | Status: AC
  Filled 2022-01-08: qty 2

## 2022-01-08 MED ORDER — OXYMETAZOLINE HCL 0.05 % NA SOLN
NASAL | Status: AC
  Filled 2022-01-08: qty 30

## 2022-01-08 MED ORDER — DEXMEDETOMIDINE HCL IN NACL 80 MCG/20ML IV SOLN
INTRAVENOUS | Status: AC
  Filled 2022-01-08: qty 20

## 2022-01-08 MED ORDER — LACTATED RINGERS IV SOLN: INTRAVENOUS | Status: DC

## 2022-01-08 MED ORDER — LIDOCAINE 2% (20 MG/ML) 5 ML SYRINGE
INTRAMUSCULAR | Status: DC | PRN
  Administered 2022-01-08: 60 mg via INTRAVENOUS

## 2022-01-08 MED ORDER — ONDANSETRON HCL 4 MG/2ML IJ SOLN
INTRAMUSCULAR | Status: DC | PRN
  Administered 2022-01-08: 4 mg via INTRAVENOUS

## 2022-01-08 MED ORDER — LIDOCAINE 2% (20 MG/ML) 5 ML SYRINGE
INTRAMUSCULAR | Status: AC
  Filled 2022-01-08: qty 10

## 2022-01-08 SURGICAL SUPPLY — 12 items
CANISTER SUCT 1200ML W/VALVE (MISCELLANEOUS) ×1 IMPLANT
GLOVE BIO SURGEON STRL SZ7.5 (GLOVE) ×1 IMPLANT
GLOVE BIOGEL PI IND STRL 7.0 (GLOVE) IMPLANT
KIT CLEAN ENDO (MISCELLANEOUS) ×1 IMPLANT
NDL HYPO 27GX1-1/4 (NEEDLE) IMPLANT
NEEDLE HYPO 27GX1-1/4 (NEEDLE) IMPLANT
PATTIES SURGICAL .5 X3 (DISPOSABLE) ×1 IMPLANT
SHEET MEDIUM DRAPE 40X70 STRL (DRAPES) ×1 IMPLANT
SOL ANTI FOG 6CC (MISCELLANEOUS) ×1 IMPLANT
SYR CONTROL 10ML LL (SYRINGE) IMPLANT
TOWEL GREEN STERILE FF (TOWEL DISPOSABLE) ×1 IMPLANT
TUBE CONNECTING 20X1/4 (TUBING) ×1 IMPLANT

## 2022-01-08 NOTE — Anesthesia Procedure Notes (Signed)
Procedure Name: MAC Date/Time: 01/08/2022 8:41 AM  Performed by: Signe Colt, CRNAPre-anesthesia Checklist: Patient identified, Emergency Drugs available, Suction available, Patient being monitored and Timeout performed Patient Re-evaluated:Patient Re-evaluated prior to induction Oxygen Delivery Method: Simple face mask

## 2022-01-08 NOTE — H&P (Signed)
Eric Blackburn is an 51 y.o. male.   Chief Complaint: Sleep apnea HPI: 51 year old male with obstructive sleep apnea who has not tolerated CPAP.  Past Medical History:  Diagnosis Date   Anxiety    on meds   Depression    on meds   Fever blister    on meds   Hx of sessile serrated colonic polyps 04/10/2020   Sleep apnea    uses CPAP    Past Surgical History:  Procedure Laterality Date   ANTERIOR CRUCIATE LIGAMENT REPAIR Right 1992   HERNIA REPAIR Bilateral    x 2 as a child    Family History  Problem Relation Age of Onset   Depression Mother    Diabetes type II Mother    Pulmonary Hypertension Mother    Stroke Sister 29       hemorrhagic post procedure   Anxiety disorder Sister    Depression Sister    Asthma Sister        developed as a child   Lung cancer Sister 45       small cell   Emphysema Maternal Grandfather    Prostate cancer Maternal Grandfather    Lymphoma Paternal Grandmother    Colon polyps Neg Hx    Colon cancer Neg Hx    Esophageal cancer Neg Hx    Stomach cancer Neg Hx    Rectal cancer Neg Hx    Social History:  reports that he has never smoked. He has been exposed to tobacco smoke. He has never used smokeless tobacco. He reports current alcohol use of about 20.0 standard drinks of alcohol per week. He reports that he does not use drugs.  Allergies: No Known Allergies  Medications Prior to Admission  Medication Sig Dispense Refill   buPROPion (WELLBUTRIN XL) 300 MG 24 hr tablet TAKE 1 TABLET BY MOUTH ONCE A DAY 90 tablet 2   escitalopram (LEXAPRO) 20 MG tablet Take 1 tablet (20 mg total) by mouth daily. 90 tablet 3   ibuprofen (ADVIL) 200 MG tablet Take 400-800 mg by mouth every 6 (six) hours as needed (pain).     melatonin 5 MG TABS Take 1 tablet (5 mg total) by mouth at bedtime as needed. 30 tablet 12   Multiple Vitamin (MULTIVITAMIN) capsule Take 1 capsule by mouth daily.     Omega-3 Fatty Acids (FISH OIL) 1000 MG CAPS Take 1 capsule by  mouth 2 (two) times daily.     oxymetazoline (AFRIN) 0.05 % nasal spray Place 1 spray into both nostrils at bedtime. Alternate nostril     Turmeric (QC TUMERIC COMPLEX PO) Take 500 mg by mouth daily.     valACYclovir (VALTREX) 1000 MG tablet TAKE 2 TABLETS BY MOUTH TWO TIMES A DAY FOR 1 DAY FOR COLD SORE AS NEEDED. 4 tablet 5   COVID-19 mRNA Vac-TriS, Pfizer, (COMIRNATY) SUSP injection Inject 0.3 mLs into the muscle. 0.3 mL 0    No results found for this or any previous visit (from the past 48 hour(s)). No results found.  Review of Systems  All other systems reviewed and are negative.   Blood pressure 113/80, pulse 88, temperature 97.6 F (36.4 C), temperature source Oral, resp. rate 20, height '5\' 11"'$  (1.803 m), weight 98.2 kg, SpO2 99 %. Physical Exam Constitutional:      Appearance: Normal appearance. He is normal weight.  HENT:     Head: Normocephalic and atraumatic.     Right Ear: External ear normal.  Left Ear: External ear normal.     Nose: Nose normal.     Mouth/Throat:     Mouth: Mucous membranes are moist.     Pharynx: Oropharynx is clear.  Eyes:     Extraocular Movements: Extraocular movements intact.     Conjunctiva/sclera: Conjunctivae normal.     Pupils: Pupils are equal, round, and reactive to light.  Cardiovascular:     Rate and Rhythm: Normal rate.  Pulmonary:     Effort: Pulmonary effort is normal.  Musculoskeletal:     Cervical back: Normal range of motion.  Skin:    General: Skin is warm and dry.  Neurological:     General: No focal deficit present.     Mental Status: He is alert and oriented to person, place, and time.  Psychiatric:        Mood and Affect: Mood normal.        Behavior: Behavior normal.        Thought Content: Thought content normal.        Judgment: Judgment normal.      Assessment/Plan Sleep apnea, BMI 30.19  To OR for sleep endoscopy.  Melida Quitter, MD 01/08/2022, 8:22 AM

## 2022-01-08 NOTE — Op Note (Signed)
Preop diagnosis: Obstructive sleep apnea Postop diagnosis: same Procedure: Drug-induced sleep endoscopy Surgeon: Redmond Baseman Anesth: IV sedation Compl: None Findings: There is 100% anterior-posterior collapse at the velum making him a good candidate for hypoglossal nerve stimulator placement.  There was also marked anterior-posterior collapse at the tongue base. Description:  After discussing risks, benefits, and alternatives, the patient was brought to the operative suite and placed on the operative table in the supine position.  Anesthesia was induced and the patient was given light sedation to simulate natural sleep. When the proper level was reached, an Afrin-soaked pledget was placed in the right nasal passage for a couple of minutes and then removed.  The fiberoptic laryngoscope was then passed to view the pharynx and larynx.  Findings are noted above and the exam was recorded.  After completion, the scope was removed and the patient was returned to anesthesia for wakeup and was moved to the recovery room in stable condition.

## 2022-01-08 NOTE — Transfer of Care (Signed)
Immediate Anesthesia Transfer of Care Note  Patient: Eric Blackburn  Procedure(s) Performed: DRUG INDUCED ENDOSCOPY (Nose)  Patient Location: PACU  Anesthesia Type:MAC  Level of Consciousness: awake, alert , oriented, and patient cooperative  Airway & Oxygen Therapy: Patient Spontanous Breathing and Patient connected to face mask oxygen  Post-op Assessment: Report given to RN and Post -op Vital signs reviewed and stable  Post vital signs: Reviewed and stable  Last Vitals:  Vitals Value Taken Time  BP    Temp    Pulse    Resp    SpO2      Last Pain:  Vitals:   01/08/22 0748  TempSrc: Oral  PainSc: 0-No pain         Complications: No notable events documented.

## 2022-01-08 NOTE — Anesthesia Postprocedure Evaluation (Signed)
Anesthesia Post Note  Patient: Eric Blackburn  Procedure(s) Performed: DRUG INDUCED ENDOSCOPY (Nose)     Patient location during evaluation: PACU Anesthesia Type: MAC Level of consciousness: awake and alert Pain management: pain level controlled Vital Signs Assessment: post-procedure vital signs reviewed and stable Respiratory status: spontaneous breathing, nonlabored ventilation and respiratory function stable Cardiovascular status: blood pressure returned to baseline Postop Assessment: no apparent nausea or vomiting Anesthetic complications: no   No notable events documented.  Last Vitals:  Vitals:   01/08/22 0850 01/08/22 0902  BP: 122/87 122/82  Pulse: (!) 59 62  Resp: 18   Temp: 36.8 C 36.6 C  SpO2: 99% 96%    Last Pain:  Vitals:   01/08/22 0902  TempSrc:   PainSc: 0-No pain                 Marthenia Rolling

## 2022-01-08 NOTE — Discharge Instructions (Signed)

## 2022-01-08 NOTE — Brief Op Note (Signed)
01/08/2022  8:42 AM  PATIENT:  Isidor Holts  51 y.o. male  PRE-OPERATIVE DIAGNOSIS:  Obstructive Sleep Apnea BMI 30.0-30.9,adult  POST-OPERATIVE DIAGNOSIS:  Obstructive Sleep ApneaBMI 30.0-30.9,adult  PROCEDURE:  Procedure(s): DRUG INDUCED ENDOSCOPY (N/A)  SURGEON:  Surgeon(s) and Role:    Melida Quitter, MD - Primary  PHYSICIAN ASSISTANT:   ASSISTANTS: none   ANESTHESIA:   IV sedation  EBL:  None   BLOOD ADMINISTERED:none  DRAINS: none   LOCAL MEDICATIONS USED:  NONE  SPECIMEN:  No Specimen  DISPOSITION OF SPECIMEN:  N/A  COUNTS:  YES  TOURNIQUET:  * No tourniquets in log *  DICTATION: .Note written in EPIC  PLAN OF CARE: Discharge to home after PACU  PATIENT DISPOSITION:  PACU - hemodynamically stable.   Delay start of Pharmacological VTE agent (>24hrs) due to surgical blood loss or risk of bleeding: no

## 2022-01-09 ENCOUNTER — Encounter (HOSPITAL_BASED_OUTPATIENT_CLINIC_OR_DEPARTMENT_OTHER): Payer: Self-pay | Admitting: Otolaryngology

## 2022-01-09 DIAGNOSIS — F331 Major depressive disorder, recurrent, moderate: Secondary | ICD-10-CM | POA: Diagnosis not present

## 2022-01-10 ENCOUNTER — Telehealth: Payer: Self-pay | Admitting: Internal Medicine

## 2022-01-13 NOTE — Telephone Encounter (Signed)
Looked and verified that sleep study had been done and scanned into chart. Printed off sleep study. Nothing further needed

## 2022-01-20 ENCOUNTER — Other Ambulatory Visit: Payer: Self-pay | Admitting: Otolaryngology

## 2022-01-21 ENCOUNTER — Encounter: Payer: Self-pay | Admitting: Family Medicine

## 2022-01-21 DIAGNOSIS — F331 Major depressive disorder, recurrent, moderate: Secondary | ICD-10-CM | POA: Diagnosis not present

## 2022-01-28 ENCOUNTER — Encounter: Payer: Self-pay | Admitting: Family Medicine

## 2022-01-28 ENCOUNTER — Ambulatory Visit (INDEPENDENT_AMBULATORY_CARE_PROVIDER_SITE_OTHER): Payer: 59 | Admitting: Family Medicine

## 2022-01-28 VITALS — BP 100/70 | HR 53 | Temp 98.3°F | Ht 71.0 in | Wt 218.2 lb

## 2022-01-28 DIAGNOSIS — F418 Other specified anxiety disorders: Secondary | ICD-10-CM

## 2022-01-28 DIAGNOSIS — Z87898 Personal history of other specified conditions: Secondary | ICD-10-CM | POA: Insufficient documentation

## 2022-01-28 DIAGNOSIS — F331 Major depressive disorder, recurrent, moderate: Secondary | ICD-10-CM | POA: Diagnosis not present

## 2022-01-28 LAB — CBC WITH DIFFERENTIAL/PLATELET
Basophils Absolute: 0.1 10*3/uL (ref 0.0–0.1)
Basophils Relative: 1.1 % (ref 0.0–3.0)
Eosinophils Absolute: 0.4 10*3/uL (ref 0.0–0.7)
Eosinophils Relative: 7.8 % — ABNORMAL HIGH (ref 0.0–5.0)
HCT: 45 % (ref 39.0–52.0)
Hemoglobin: 15.1 g/dL (ref 13.0–17.0)
Lymphocytes Relative: 28.1 % (ref 12.0–46.0)
Lymphs Abs: 1.4 10*3/uL (ref 0.7–4.0)
MCHC: 33.6 g/dL (ref 30.0–36.0)
MCV: 93.8 fl (ref 78.0–100.0)
Monocytes Absolute: 0.5 10*3/uL (ref 0.1–1.0)
Monocytes Relative: 11.1 % (ref 3.0–12.0)
Neutro Abs: 2.5 10*3/uL (ref 1.4–7.7)
Neutrophils Relative %: 51.9 % (ref 43.0–77.0)
Platelets: 260 10*3/uL (ref 150.0–400.0)
RBC: 4.79 Mil/uL (ref 4.22–5.81)
RDW: 12.5 % (ref 11.5–15.5)
WBC: 4.9 10*3/uL (ref 4.0–10.5)

## 2022-01-28 LAB — HEPATIC FUNCTION PANEL
ALT: 29 U/L (ref 0–53)
AST: 23 U/L (ref 0–37)
Albumin: 4.6 g/dL (ref 3.5–5.2)
Alkaline Phosphatase: 47 U/L (ref 39–117)
Bilirubin, Direct: 0.1 mg/dL (ref 0.0–0.3)
Total Bilirubin: 0.4 mg/dL (ref 0.2–1.2)
Total Protein: 6.8 g/dL (ref 6.0–8.3)

## 2022-01-28 NOTE — Assessment & Plan Note (Signed)
Alcohol mis use/over use prior (wife is alcoholic and in liver failure)  This was self tx for his own dep/anx  Has quit for a month and doing well  Declines AA but plans to start with ALANON meetings Continue counseling   Interested in naltrexone This may be a good option to help cravings LFT today- if nl will start 50 mg daily (with opt to inc to 100 after 1-2 wk)  Consider 12 wk course if helpful  Need to watch mood closely  Disc side eff

## 2022-01-28 NOTE — Patient Instructions (Signed)
Labs today for cbc and liver   If normal-will go ahead and send in naltrexone 50 mg daily   After 1-2 weeks if well tolerated we can consider 100 mg daily   We need to watch your mood  If it worsens (more depressed or anxious) hold the medication and let us know   Continue your medications and counseling  Alanon is a good idea Try and practice some good self care

## 2022-01-28 NOTE — Progress Notes (Signed)
Subjective:    Patient ID: Eric Blackburn, male    DOB: [birth year], 51 y.o.   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, commissioning   Current job is more political   Was drinking 6-9 beers per day (self treating his depression)  It did make him feel better  No withdrawal at all  He cut to 1-2 per day before he quit   In past- had quit for a month a year ago Then gradually went back to mod drinking  Stress   Has abstained from etoh since nov 5th  Tolerable / still has cravings  No etoh in the house   No other drugs  No narcotics now or every   Has surgery planned in January - for the inspire    Seeing a counselor - Kandra Nicolas at [facility] (really likes)  She has experience with alcohol as well  Very helpful   Not ready to go to [place] Interested in [place] 20 mg daily  Welbutrin xl 300   He did not tolerate pritiq   Interested in meditation  Likes to walk - had a foot problem and ibuprofen helped  Sharp pain R ball of foot, getting better    Interested in naltrexone for alcohol dependence   BP Readings from Last 3 Encounters:  01/08/22 122/82  10/21/21 116/84  09/17/21 118/80   Pulse Readings from Last 3 Encounters:  01/08/22 62  10/21/21 74  09/17/21 83     Withdrawal sympt   Lab Results  Component Value Date   ALT 20 09/18/2021   AST 23 09/18/2021   ALKPHOS 52 09/18/2021   BILITOT 0.6 09/18/2021    Was going to apply for intermittent FMLA to help with care of his wife (to get her to appts)   Patient Active Problem List   Diagnosis Date Noted   History of alcohol use  01/28/2022   Hx of sessile serrated colonic polyps 04/10/2020   Depressed mood 04/04/2019   Hyperkalemia 12/24/2016   Prostate cancer screening 09/10/2015   Obstructive sleep apnea 05/25/2013   Back pain 02/29/2008   Depression with anxiety 03/31/2007   HYPOSPADIAS 03/31/2007   Past Medical History:  Diagnosis Date   Anxiety    on meds   Depression    on meds   Fever blister    on meds   Hx of sessile serrated colonic polyps 04/10/2020   Sleep apnea    uses CPAP   Past Surgical History:  Procedure Laterality Date   ANTERIOR CRUCIATE LIGAMENT REPAIR Right 1992   DRUG INDUCED ENDOSCOPY N/A 01/08/2022   Procedure: DRUG INDUCED ENDOSCOPY;  Surgeon: Melida Quitter, MD;  Location: [facility];  Service: ENT;  Laterality: N/A;   HERNIA REPAIR Bilateral    x 2 as a child   Social History   Tobacco Use   Smoking status: Never    Passive exposure: Past   Smokeless tobacco: Never  Vaping Use   Vaping Use: Never used  Substance Use Topics  Alcohol use: Yes    Alcohol/week: 20.0 standard drinks of alcohol    Types: 20 Standard drinks or equivalent per week    Comment: 1-4 drinks daily (beer)   Drug use: No   Family History  Problem Relation Age of Onset   Depression Mother    Diabetes type II Mother    Pulmonary Hypertension Mother    Stroke Sister 55       hemorrhagic post procedure   Anxiety disorder Sister    Depression Sister    Asthma Sister        developed as a child   Lung cancer Sister 81       small cell   Emphysema Maternal Grandfather    Prostate cancer Maternal Grandfather    Lymphoma Paternal Grandmother    Colon polyps Neg Hx    Colon cancer Neg Hx    Esophageal cancer Neg Hx    Stomach cancer Neg Hx    Rectal cancer Neg Hx    No Known Allergies Current Outpatient Medications on File Prior to Visit  Medication Sig Dispense Refill   buPROPion (WELLBUTRIN XL) 300 MG 24 hr tablet TAKE 1 TABLET BY MOUTH ONCE A DAY 90 tablet 2    escitalopram (LEXAPRO) 20 MG tablet Take 1 tablet (20 mg total) by mouth daily. 90 tablet 3   ibuprofen (ADVIL) 200 MG tablet Take 400-800 mg by mouth every 6 (six) hours as needed (pain).     melatonin 5 MG TABS Take 1 tablet (5 mg total) by mouth at bedtime as needed. 30 tablet 12   Multiple Vitamin (MULTIVITAMIN) capsule Take 1 capsule by mouth daily.     Omega-3 Fatty Acids (FISH OIL) 1000 MG CAPS Take 1 capsule by mouth 2 (two) times daily.     oxymetazoline (AFRIN) 0.05 % nasal spray Place 1 spray into both nostrils at bedtime. Alternate nostril     Turmeric (QC TUMERIC COMPLEX PO) Take 500 mg by mouth in the morning and at bedtime.     valACYclovir (VALTREX) 1000 MG tablet TAKE 2 TABLETS BY MOUTH TWO TIMES A DAY FOR 1 DAY FOR COLD SORE AS NEEDED. 4 tablet 5   No current facility-administered medications on file prior to visit.    Review of Systems  Constitutional:  Negative for activity change, appetite change, fatigue, fever and unexpected weight change.  HENT:  Negative for congestion, rhinorrhea, sore throat and trouble swallowing.   Eyes:  Negative for pain, redness, itching and visual disturbance.  Respiratory:  Negative for cough, chest tightness, shortness of breath and wheezing.   Cardiovascular:  Negative for chest pain and palpitations.  Gastrointestinal:  Negative for abdominal pain, blood in stool, constipation, diarrhea and nausea.  Endocrine: Negative for cold intolerance, heat intolerance, polydipsia and polyuria.  Genitourinary:  Negative for difficulty urinating, dysuria, frequency and urgency.  Musculoskeletal:  Negative for arthralgias, joint swelling and myalgias.  Skin:  Negative for pallor and rash.  Neurological:  Negative for dizziness, tremors, weakness, numbness and headaches.  Hematological:  Negative for adenopathy. Does not bruise/bleed easily.  Psychiatric/Behavioral:  Positive for dysphoric mood and sleep disturbance. Negative for decreased concentration  and suicidal ideas. The patient is nervous/anxious.        Objective:   Physical Exam Constitutional:      General: He is not in acute distress.    Appearance: Normal appearance. He is well-developed. He is obese. He is not ill-appearing or diaphoretic.  HENT:  Head: Normocephalic and atraumatic.  Eyes:     Conjunctiva/sclera: Conjunctivae normal.     Pupils: Pupils are equal, round, and reactive to light.  Neck:     Thyroid: No thyromegaly.     Vascular: No carotid bruit or JVD.  Cardiovascular:     Rate and Rhythm: Normal rate and regular rhythm.     Heart sounds: Normal heart sounds.     No gallop.  Pulmonary:     Effort: Pulmonary effort is normal. No respiratory distress.     Breath sounds: Normal breath sounds. No wheezing or rales.  Abdominal:     General: Abdomen is protuberant. Bowel sounds are normal. There is no distension or abdominal bruit.     Palpations: Abdomen is soft. There is no shifting dullness, fluid wave, hepatomegaly or splenomegaly.     Tenderness: There is no abdominal tenderness.  Musculoskeletal:     Cervical back: Normal range of motion and neck supple.     Right lower leg: No edema.     Left lower leg: No edema.  Lymphadenopathy:     Cervical: No cervical adenopathy.  Skin:    General: Skin is warm and dry.     Coloration: Skin is not pale.     Findings: No rash.  Neurological:     Mental Status: He is alert.     Coordination: Coordination normal.     Deep Tendon Reflexes: Reflexes are normal and symmetric. Reflexes normal.  Psychiatric:        Attention and Perception: Attention normal.        Mood and Affect: Mood is depressed.        Behavior: Behavior normal.        Thought Content: Thought content is not paranoid or delusional. Thought content does not include suicidal ideation.        Cognition and Memory: Cognition and memory normal.     Comments: Candidly discusses symptoms and stressors             Assessment & Plan:    Problem List Items Addressed This Visit       Other   Depression with anxiety - Primary    Struggling with stressors incl work and wife with alcoholic liver dz  Seeing therapist which is most helpful  Planning on starting alanon program Also working with his own alcohol issues (no drink for about a month)  Plan to continue lexapro 20 and wellbutrin xl 300 mg daily  Continue therapy Interested in naltrexone for alcohol cravings - will need to make sure this does not worsen mood Looking at fmla to care for wife  May need for self later  Strongly enc self care Good insight today      History of alcohol use    Alcohol mis use/over use prior (wife is alcoholic and in liver failure)  This was self tx for his own dep/anx  Has quit for a month and doing well  Declines AA but plans to start with [facility] meetings Continue counseling   Interested in naltrexone This may be a good option to help cravings LFT today- if nl will start 50 mg daily (with opt to inc to 100 after 1-2 wk)  Consider 12 wk course if helpful  Need to watch mood closely  Disc side eff       Relevant Orders   CBC with Differential/Platelet   Hepatic function panel

## 2022-01-28 NOTE — Assessment & Plan Note (Signed)
Struggling with stressors incl work and wife with alcoholic liver dz  Seeing therapist which is most helpful  Planning on starting alanon program Also working with his own alcohol issues (no drink for about a month)  Plan to continue lexapro 20 and wellbutrin xl 300 mg daily  Continue therapy Interested in naltrexone for alcohol cravings - will need to make sure this does not worsen mood Looking at fmla to care for wife  May need for self later  Strongly enc self care Good insight today

## 2022-01-29 MED ORDER — NALTREXONE HCL 50 MG PO TABS
50.0000 mg | ORAL_TABLET | Freq: Every day | ORAL | 1 refills | Status: DC
  Filled 2022-01-29: qty 30, 30d supply, fill #0
  Filled 2022-03-05: qty 30, 30d supply, fill #1

## 2022-01-29 NOTE — Addendum Note (Signed)
Addended by: Loura Pardon A on: 01/29/2022 09:10 PM   Modules accepted: Orders

## 2022-01-30 ENCOUNTER — Other Ambulatory Visit (HOSPITAL_COMMUNITY): Payer: Self-pay

## 2022-01-31 ENCOUNTER — Other Ambulatory Visit (HOSPITAL_COMMUNITY): Payer: Self-pay

## 2022-02-08 DIAGNOSIS — G4733 Obstructive sleep apnea (adult) (pediatric): Secondary | ICD-10-CM | POA: Diagnosis not present

## 2022-02-13 ENCOUNTER — Encounter (HOSPITAL_BASED_OUTPATIENT_CLINIC_OR_DEPARTMENT_OTHER): Payer: Self-pay | Admitting: Otolaryngology

## 2022-02-21 DIAGNOSIS — F331 Major depressive disorder, recurrent, moderate: Secondary | ICD-10-CM | POA: Diagnosis not present

## 2022-02-24 ENCOUNTER — Encounter: Payer: Self-pay | Admitting: Family Medicine

## 2022-02-24 DIAGNOSIS — Z125 Encounter for screening for malignant neoplasm of prostate: Secondary | ICD-10-CM

## 2022-02-25 ENCOUNTER — Ambulatory Visit (HOSPITAL_BASED_OUTPATIENT_CLINIC_OR_DEPARTMENT_OTHER): Admission: RE | Admit: 2022-02-25 | Payer: 59 | Source: Home / Self Care | Admitting: Otolaryngology

## 2022-02-25 DIAGNOSIS — E875 Hyperkalemia: Secondary | ICD-10-CM

## 2022-02-25 SURGERY — INSERTION, HYPOGLOSSAL NERVE STIMULATOR
Anesthesia: General | Laterality: Right

## 2022-02-26 ENCOUNTER — Encounter (HOSPITAL_COMMUNITY): Payer: Self-pay

## 2022-02-26 ENCOUNTER — Other Ambulatory Visit (HOSPITAL_COMMUNITY): Payer: Self-pay

## 2022-02-27 DIAGNOSIS — F331 Major depressive disorder, recurrent, moderate: Secondary | ICD-10-CM | POA: Diagnosis not present

## 2022-02-27 NOTE — Assessment & Plan Note (Signed)
Lab Results  Component Value Date   PSA 3.11 09/18/2021   PSA 2.21 01/10/2020   PSA 1.29 01/12/2019

## 2022-03-09 DIAGNOSIS — G4733 Obstructive sleep apnea (adult) (pediatric): Secondary | ICD-10-CM | POA: Diagnosis not present

## 2022-03-11 DIAGNOSIS — F331 Major depressive disorder, recurrent, moderate: Secondary | ICD-10-CM | POA: Diagnosis not present

## 2022-03-14 ENCOUNTER — Other Ambulatory Visit (INDEPENDENT_AMBULATORY_CARE_PROVIDER_SITE_OTHER): Payer: 59

## 2022-03-14 DIAGNOSIS — Z125 Encounter for screening for malignant neoplasm of prostate: Secondary | ICD-10-CM | POA: Diagnosis not present

## 2022-03-14 LAB — PSA: PSA: 2.8 ng/mL (ref 0.10–4.00)

## 2022-03-27 DIAGNOSIS — F331 Major depressive disorder, recurrent, moderate: Secondary | ICD-10-CM | POA: Diagnosis not present

## 2022-03-28 ENCOUNTER — Other Ambulatory Visit (HOSPITAL_COMMUNITY): Payer: Self-pay

## 2022-03-28 ENCOUNTER — Telehealth: Payer: 59 | Admitting: Physician Assistant

## 2022-03-28 DIAGNOSIS — B9689 Other specified bacterial agents as the cause of diseases classified elsewhere: Secondary | ICD-10-CM

## 2022-03-28 DIAGNOSIS — J019 Acute sinusitis, unspecified: Secondary | ICD-10-CM

## 2022-03-28 MED ORDER — DOXYCYCLINE HYCLATE 100 MG PO TABS
100.0000 mg | ORAL_TABLET | Freq: Two times a day (BID) | ORAL | 0 refills | Status: DC
  Filled 2022-03-28: qty 20, 10d supply, fill #0

## 2022-03-28 NOTE — Progress Notes (Signed)
Virtual Visit Consent   Eric Blackburn, you are scheduled for a virtual visit with a [facility] provider today. Just as with appointments in the office, your consent must be obtained to participate. Your consent will be active for this visit and any virtual visit you may have with one of our providers in the next 365 days. If you have a MyChart account, a copy of this consent can be sent to you electronically.  As this is a virtual visit, video technology does not allow for your provider to perform a traditional examination. This may limit your provider's ability to fully assess your condition. If your provider identifies any concerns that need to be evaluated in person or the need to arrange testing (such as labs, EKG, etc.), we will make arrangements to do so. Although advances in technology are sophisticated, we cannot ensure that it will always work on either your end or our end. If the connection with a video visit is poor, the visit may have to be switched to a telephone visit. With either a video or telephone visit, we are not always able to ensure that we have a secure connection.  By engaging in this virtual visit, you consent to the provision of healthcare and authorize for your insurance to be billed (if applicable) for the services provided during this visit. Depending on your insurance coverage, you may receive a charge related to this service.  I need to obtain your verbal consent now. Are you willing to proceed with your visit today? JEVAUN STRICK has provided verbal consent on 03/28/2022 for a virtual visit (video or telephone). Mar Daring, PA-C  Date: 03/28/2022 1:37 PM  Virtual Visit via Video Note   I, Mar Daring, connected with  Eric Blackburn  ([id number], [birth year]) on 03/28/22 at  1:30 PM EST by a video-enabled telemedicine application and verified that I am speaking with the correct person using two identifiers.  Location: Patient: Virtual Visit  Location Patient: Other: work; isolated Provider: Scientist, research (medical) Provider: Home Office   I discussed the limitations of evaluation and management by telemedicine and the availability of in person appointments. The patient expressed understanding and agreed to proceed.    History of Present Illness: Eric Blackburn is a 52 y.o. who identifies as a male who was assigned male at birth, and is being seen today for possible sinus infection.  HPI: Sinusitis This is a new problem. The current episode started 1 to 4 weeks ago. The problem has been gradually worsening since onset. There has been no fever. The pain is moderate. Associated symptoms include congestion, headaches, sinus pressure and a sore throat. Pertinent negatives include no chills, coughing, ear pain, hoarse voice or sneezing. Swollen glands: initially, now improved.(Throat clearing from post nasal drainage) Treatments tried: netti pot, sudafed, mucinex, afrin. The treatment provided no relief.     Problems:  Patient Active Problem List   Diagnosis Date Noted   History of alcohol use 01/28/2022   Hx of sessile serrated colonic polyps 04/10/2020   Depressed mood 04/04/2019   Hyperkalemia 12/24/2016   Prostate cancer screening 09/10/2015   Obstructive sleep apnea 05/25/2013   Back pain 02/29/2008   Depression with anxiety 03/31/2007   HYPOSPADIAS 03/31/2007    Allergies: No Known Allergies Medications:  Current Outpatient Medications:    doxycycline (VIBRA-TABS) 100 MG tablet, Take 1 tablet (100 mg total) by mouth 2 (two) times daily., Disp: 20 tablet, Rfl: 0   buPROPion [facility]  XL) 300 MG 24 hr tablet, TAKE 1 TABLET BY MOUTH ONCE A DAY, Disp: 90 tablet, Rfl: 2   escitalopram (LEXAPRO) 20 MG tablet, Take 1 tablet (20 mg total) by mouth daily., Disp: 90 tablet, Rfl: 3   ibuprofen (ADVIL) 200 MG tablet, Take 400-800 mg by mouth every 6 (six) hours as needed (pain)., Disp: , Rfl:    melatonin 5 MG TABS, Take 1 tablet  (5 mg total) by mouth at bedtime as needed., Disp: 30 tablet, Rfl: 12   Multiple Vitamin (MULTIVITAMIN) capsule, Take 1 capsule by mouth daily., Disp: , Rfl:    naltrexone (DEPADE) 50 MG tablet, Take 1 tablet (50 mg total) by mouth daily., Disp: 30 tablet, Rfl: 1   Omega-3 Fatty Acids (FISH OIL) 1000 MG CAPS, Take 1 capsule by mouth 2 (two) times daily., Disp: , Rfl:    oxymetazoline (AFRIN) 0.05 % nasal spray, Place 1 spray into both nostrils at bedtime. Alternate nostril, Disp: , Rfl:    Turmeric (QC TUMERIC COMPLEX PO), Take 500 mg by mouth in the morning and at bedtime., Disp: , Rfl:    valACYclovir (VALTREX) 1000 MG tablet, TAKE 2 TABLETS BY MOUTH TWO TIMES A DAY FOR 1 DAY FOR COLD SORE AS NEEDED., Disp: 4 tablet, Rfl: 5  Observations/Objective: Patient is well-developed, well-nourished in no acute distress.  Resting comfortably Head is normocephalic, atraumatic.  No labored breathing.  Speech is clear and coherent with logical content.  Patient is alert and oriented at baseline.    Assessment and Plan: 1. Acute bacterial sinusitis - doxycycline (VIBRA-TABS) 100 MG tablet; Take 1 tablet (100 mg total) by mouth 2 (two) times daily.  Dispense: 20 tablet; Refill: 0  - Worsening symptoms that have not responded to OTC medications.  - Will give Doxycycline - Continue allergy medications.  - Steam and humidifier can help - Stay well hydrated and get plenty of rest.  - Seek in person evaluation if no symptom improvement or if symptoms worsen   Follow Up Instructions: I discussed the assessment and treatment plan with the patient. The patient was provided an opportunity to ask questions and all were answered. The patient agreed with the plan and demonstrated an understanding of the instructions.  A copy of instructions were sent to the patient via MyChart unless otherwise noted below.    The patient was advised to call back or seek an in-person evaluation if the symptoms worsen or if  the condition fails to improve as anticipated.  Time:  I spent 8 minutes with the patient via telehealth technology discussing the above problems/concerns.    Mar Daring, PA-C

## 2022-03-28 NOTE — Patient Instructions (Signed)
Eric Blackburn, thank you for joining Mar Daring, PA-C for today's virtual visit.  While this provider is not your primary care provider (PCP), if your PCP is located in our provider database this encounter information will be shared with them immediately following your visit.   [facility] account gives you access to today's visit and all your visits, tests, and labs performed at [facility] " click here if you don't have a [facility] account or go to [email or web address]  Consent: (Patient) Eric Blackburn provided verbal consent for this virtual visit at the beginning of the encounter.  Current Medications:  Current Outpatient Medications:    doxycycline (VIBRA-TABS) 100 MG tablet, Take 1 tablet (100 mg total) by mouth 2 (two) times daily., Disp: 20 tablet, Rfl: 0   buPROPion (WELLBUTRIN XL) 300 MG 24 hr tablet, TAKE 1 TABLET BY MOUTH ONCE A DAY, Disp: 90 tablet, Rfl: 2   escitalopram (LEXAPRO) 20 MG tablet, Take 1 tablet (20 mg total) by mouth daily., Disp: 90 tablet, Rfl: 3   ibuprofen (ADVIL) 200 MG tablet, Take 400-800 mg by mouth every 6 (six) hours as needed (pain)., Disp: , Rfl:    melatonin 5 MG TABS, Take 1 tablet (5 mg total) by mouth at bedtime as needed., Disp: 30 tablet, Rfl: 12   Multiple Vitamin (MULTIVITAMIN) capsule, Take 1 capsule by mouth daily., Disp: , Rfl:    naltrexone (DEPADE) 50 MG tablet, Take 1 tablet (50 mg total) by mouth daily., Disp: 30 tablet, Rfl: 1   Omega-3 Fatty Acids (FISH OIL) 1000 MG CAPS, Take 1 capsule by mouth 2 (two) times daily., Disp: , Rfl:    oxymetazoline (AFRIN) 0.05 % nasal spray, Place 1 spray into both nostrils at bedtime. Alternate nostril, Disp: , Rfl:    Turmeric (QC TUMERIC COMPLEX PO), Take 500 mg by mouth in the morning and at bedtime., Disp: , Rfl:    valACYclovir (VALTREX) 1000 MG tablet, TAKE 2 TABLETS BY MOUTH TWO TIMES A DAY FOR 1 DAY FOR COLD SORE AS NEEDED., Disp: 4 tablet,  Rfl: 5   Medications ordered in this encounter:  Meds ordered this encounter  Medications   doxycycline (VIBRA-TABS) 100 MG tablet    Sig: Take 1 tablet (100 mg total) by mouth 2 (two) times daily.    Dispense:  20 tablet    Refill:  0    Order Specific Question:   Supervising Provider    Answer:   Chase Picket [id number]     *If you need refills on other medications prior to your next appointment, please contact your pharmacy*  Follow-Up: Call back or seek an in-person evaluation if the symptoms worsen or if the condition fails to improve as anticipated.  [facility] [phone]  Other Instructions  Sinus Infection, Adult A sinus infection, also called sinusitis, is inflammation of your sinuses. Sinuses are hollow spaces in the bones around your face. Your sinuses are located: Around your eyes. In the middle of your forehead. Behind your nose. In your cheekbones. Mucus normally drains out of your sinuses. When your nasal tissues become inflamed or swollen, mucus can become trapped or blocked. This allows bacteria, viruses, and fungi to grow, which leads to infection. Most infections of the sinuses are caused by a virus. A sinus infection can develop quickly. It can last for up to 4 weeks (acute) or for more than 12 weeks (chronic). A sinus infection often develops after a  cold. What are the causes? This condition is caused by anything that creates swelling in the sinuses or stops mucus from draining. This includes: Allergies. Asthma. Infection from bacteria or viruses. Deformities or blockages in your nose or sinuses. Abnormal growths in the nose (nasal polyps). Pollutants, such as chemicals or irritants in the air. Infection from fungi. This is rare. What increases the risk? You are more likely to develop this condition if you: Have a weak body defense system (immune system). Do a lot of swimming or diving. Overuse nasal sprays. Smoke. What are the  signs or symptoms? The main symptoms of this condition are pain and a feeling of pressure around the affected sinuses. Other symptoms include: Stuffy nose or congestion that makes it difficult to breathe through your nose. Thick yellow or greenish drainage from your nose. Tenderness, swelling, and warmth over the affected sinuses. A cough that may get worse at night. Decreased sense of smell and taste. Extra mucus that collects in the throat or the back of the nose (postnasal drip) causing a sore throat or bad breath. Tiredness (fatigue). Fever. How is this diagnosed? This condition is diagnosed based on: Your symptoms. Your medical history. A physical exam. Tests to find out if your condition is acute or chronic. This may include: Checking your nose for nasal polyps. Viewing your sinuses using a device that has a light (endoscope). Testing for allergies or bacteria. Imaging tests, such as an MRI or CT scan. In rare cases, a bone biopsy may be done to rule out more serious types of fungal sinus disease. How is this treated? Treatment for a sinus infection depends on the cause and whether your condition is chronic or acute. If caused by a virus, your symptoms should go away on their own within 10 days. You may be given medicines to relieve symptoms. They include: Medicines that shrink swollen nasal passages (decongestants). A spray that eases inflammation of the nostrils (topical intranasal corticosteroids). Rinses that help get rid of thick mucus in your nose (nasal saline washes). Medicines that treat allergies (antihistamines). Over-the-counter pain relievers. If caused by bacteria, your health care provider may recommend waiting to see if your symptoms improve. Most bacterial infections will get better without antibiotic medicine. You may be given antibiotics if you have: A severe infection. A weak immune system. If caused by narrow nasal passages or nasal polyps, surgery may be  needed. Follow these instructions at home: Medicines Take, use, or apply over-the-counter and prescription medicines only as told by your health care provider. These may include nasal sprays. If you were prescribed an antibiotic medicine, take it as told by your health care provider. Do not stop taking the antibiotic even if you start to feel better. Hydrate and humidify  Drink enough fluid to keep your urine pale yellow. Staying hydrated will help to thin your mucus. Use a cool mist humidifier to keep the humidity level in your home above 50%. Inhale steam for 10-15 minutes, 3-4 times a day, or as told by your health care provider. You can do this in the bathroom while a hot shower is running. Limit your exposure to cool or dry air. Rest Rest as much as possible. Sleep with your head raised (elevated). Make sure you get enough sleep each night. General instructions  Apply a warm, moist washcloth to your face 3-4 times a day or as told by your health care provider. This will help with discomfort. Use nasal saline washes as often as told by  your health care provider. Wash your hands often with soap and water to reduce your exposure to germs. If soap and water are not available, use hand sanitizer. Do not smoke. Avoid being around people who are smoking (secondhand smoke). Keep all follow-up visits. This is important. Contact a health care provider if: You have a fever. Your symptoms get worse. Your symptoms do not improve within 10 days. Get help right away if: You have a severe headache. You have persistent vomiting. You have severe pain or swelling around your face or eyes. You have vision problems. You develop confusion. Your neck is stiff. You have trouble breathing. These symptoms may be an emergency. Get help right away. Call 911. Do not wait to see if the symptoms will go away. Do not drive yourself to the hospital. Summary A sinus infection is soreness and inflammation of  your sinuses. Sinuses are hollow spaces in the bones around your face. This condition is caused by nasal tissues that become inflamed or swollen. The swelling traps or blocks the flow of mucus. This allows bacteria, viruses, and fungi to grow, which leads to infection. If you were prescribed an antibiotic medicine, take it as told by your health care provider. Do not stop taking the antibiotic even if you start to feel better. Keep all follow-up visits. This is important. This information is not intended to replace advice given to you by your health care provider. Make sure you discuss any questions you have with your health care provider. Document Revised: 01/15/2021 Document Reviewed: 01/15/2021 Elsevier Patient Education  [employer].    If you have been instructed to have an in-person evaluation today at a local Urgent Care facility, please use the link below. It will take you to a list of all of our available [facility], including address, phone number and hours of operation. Please do not delay care.  [facility]  If you or a family member do not have a primary care provider, use the link below to schedule a visit and establish care. When you choose a [facility] primary care physician or advanced practice provider, you gain a long-term partner in health. Find a Primary Care Provider  Learn more about 's in-office and virtual care options: [facility]

## 2022-04-03 ENCOUNTER — Encounter: Payer: Self-pay | Admitting: Family Medicine

## 2022-04-09 ENCOUNTER — Encounter: Payer: Self-pay | Admitting: Family Medicine

## 2022-04-09 ENCOUNTER — Ambulatory Visit: Payer: 59 | Admitting: Family Medicine

## 2022-04-09 VITALS — BP 100/60 | HR 62 | Temp 97.7°F | Ht 71.0 in | Wt 210.5 lb

## 2022-04-09 DIAGNOSIS — B070 Plantar wart: Secondary | ICD-10-CM

## 2022-04-09 DIAGNOSIS — G5761 Lesion of plantar nerve, right lower limb: Secondary | ICD-10-CM | POA: Diagnosis not present

## 2022-04-09 DIAGNOSIS — G4733 Obstructive sleep apnea (adult) (pediatric): Secondary | ICD-10-CM | POA: Diagnosis not present

## 2022-04-09 MED ORDER — TRIAMCINOLONE ACETONIDE 40 MG/ML IJ SUSP
20.0000 mg | Freq: Once | INTRAMUSCULAR | Status: AC
  Administered 2022-04-09: 20 mg via INTRA_ARTICULAR

## 2022-04-09 NOTE — Progress Notes (Signed)
Eric Blackburn T. Eric Fosco, MD, Eric Blackburn at [facility] Eric Blackburn [place], [address]  Phone: [phone]  FAX: [place] - 52 y.o. male  MRN [id number]  Date of Birth: February 17, 1971  Date: 04/09/2022  PCP: Abner Greenspan, MD  Referral: Abner Greenspan, MD  Chief Complaint  Patient presents with   Foot Pain    Pain in bottom of right foot   Subjective:   Eric Blackburn is a 52 y.o. very pleasant male patient with Body mass index is 29.36 kg/m. who presents with the following:  The patient presents with ongoing right-sided foot pain.  4 months ago, started with some pain in the ball of his foot.  Distinct spot that will hurt when on the bottom.  He is having pain between the second and third metatarsal heads.  There is pain, and there is also some mild numbness.  He has not had any kind of distinct trauma or injury.  It does seem to help when he wears his Superfeet orthotics.  He cannot wear these on all of his shoes.  When he goes to the gym, it will start hurting for a while.  Tried to take some ibuprofen 400 bid, but it did not really help   2nd and 3rd Neuroma  Neuroma injection  Wart frozen R foot on plantar aspect.  He also has a painful wart on the bottom of his foot on the right side.  Review of Systems is noted in the HPI, as appropriate  Objective:   BP 100/60   Pulse 62   Temp 97.7 F (36.5 C) (Temporal)   Ht 5' 11"$  (1.803 m)   Wt 210 lb 8 oz (95.5 kg)   SpO2 96%   BMI 29.36 kg/m   GEN: No acute distress; alert,appropriate. PULM: Breathing comfortably in no respiratory distress PSYCH: Normally interactive.   Right foot: At the second and third webspace just caudal to the metatarsal heads with compression, there is pain.  There is no significant pain between any other metatarsal heads.  Nontender along all metatarsals, MTP joints, phalanges.  The midfoot, hindfoot, and ankle are all  nontender.  Tibia and fibula are nontender.  There is a small wart on the plantar aspect of the fourth foot, as well.  Laboratory and Imaging Data:  Assessment and Plan:     ICD-10-CM   1. Morton's neuroma of second interspace of right foot  G57.61 triamcinolone acetonide (KENALOG-40) injection 20 mg    2. Plantar wart of right foot  B07.0      This is a classic history, exam and typical location for a Morton's neuroma.  I placed a metatarsal pad on the right foot, which should unload the area and decrease symptoms from Morton's neuroma.  I am also going to inject the neuroma to try to bring him more immediate relief.  Is a relatively small plantar wart, and I think that it will respond well to cryotherapy.  Morton's Neuroma Injection Procedure Note MILTON LANIER June 18, 1970 Date of procedure: 04/09/2022  Procedure: CPT [id number] - injection(s), anesthetic agent and/or steroid, plantar common digital nerve(s) (e.g., Morton's neuroma) Indications: Pain  Procedure Details Verbal consent was obtained. Risks (including potential skin bleaching or atrophy), benefits, and alternatives were discussed. Potential complications including loss of pigment and atrophy were discussed. Prepped with Chloraprep and Ethyl Chloride used for anesthesia. Under sterile conditions, the patient was injected at the point  of maximal tenderness proximal to the web space between the 2nd and 3rd metatarsal heads with 1/2 cc of Lidocaine 1% and Kenalog 20 mg. Decreased pain after injection. No complications.  Medication: 1/2 cc of Kenalog 40 mg (equaling Kenalog 20 mg)   Cryotherapy  Reason: Plantar wart Location: Plantar aspect of the right foot  Liquid nitrogen was applied using the liquid nitrogen gun without difficulty with an otoscope tip for concentration. Tolerated well without complications.   Medication Management during today's office visit: Meds ordered this encounter  Medications   triamcinolone  acetonide (KENALOG-40) injection 20 mg   Medications Discontinued During This Encounter  Medication Reason   doxycycline (VIBRA-TABS) 100 MG tablet Completed Course    Orders placed today for conditions managed today: No orders of the defined types were placed in this encounter.   Disposition: No follow-ups on file.  [employer] One speech-to-text software was used for transcription in this dictation.  Possible transcriptional errors can occur using Editor, commissioning.   Signed,  Maud Deed. Carrah Eppolito, MD   Outpatient Encounter Medications as of 04/09/2022  Medication Sig   buPROPion (WELLBUTRIN XL) 300 MG 24 hr tablet TAKE 1 TABLET BY MOUTH ONCE A DAY   escitalopram (LEXAPRO) 20 MG tablet Take 1 tablet (20 mg total) by mouth daily.   ibuprofen (ADVIL) 200 MG tablet Take 400-800 mg by mouth every 6 (six) hours as needed (pain).   melatonin 5 MG TABS Take 1 tablet (5 mg total) by mouth at bedtime as needed.   Multiple Vitamin (MULTIVITAMIN) capsule Take 1 capsule by mouth daily.   naltrexone (DEPADE) 50 MG tablet Take 1 tablet (50 mg total) by mouth daily.   Omega-3 Fatty Acids (FISH OIL) 1000 MG CAPS Take 1 capsule by mouth 2 (two) times daily.   oxymetazoline (AFRIN) 0.05 % nasal spray Place 1 spray into both nostrils at bedtime. Alternate nostril   Turmeric (QC TUMERIC COMPLEX PO) Take 500 mg by mouth in the morning and at bedtime.   valACYclovir (VALTREX) 1000 MG tablet TAKE 2 TABLETS BY MOUTH TWO TIMES A DAY FOR 1 DAY FOR COLD SORE AS NEEDED.   [DISCONTINUED] doxycycline (VIBRA-TABS) 100 MG tablet Take 1 tablet (100 mg total) by mouth 2 (two) times daily.   [EXPIRED] triamcinolone acetonide (KENALOG-40) injection 20 mg    No facility-administered encounter medications on file as of 04/09/2022.

## 2022-04-16 ENCOUNTER — Other Ambulatory Visit: Payer: Self-pay | Admitting: Family Medicine

## 2022-04-17 ENCOUNTER — Other Ambulatory Visit (HOSPITAL_COMMUNITY): Payer: Self-pay

## 2022-04-17 ENCOUNTER — Other Ambulatory Visit: Payer: Self-pay

## 2022-04-17 MED ORDER — NALTREXONE HCL 50 MG PO TABS
50.0000 mg | ORAL_TABLET | Freq: Every day | ORAL | 2 refills | Status: DC
  Filled 2022-04-17: qty 30, 30d supply, fill #0
  Filled 2022-05-22: qty 30, 30d supply, fill #1
  Filled 2022-06-26: qty 30, 30d supply, fill #2

## 2022-04-17 NOTE — Telephone Encounter (Signed)
Last filled on 01/29/22 #30, with 1 refill

## 2022-04-23 ENCOUNTER — Ambulatory Visit (INDEPENDENT_AMBULATORY_CARE_PROVIDER_SITE_OTHER): Payer: 59

## 2022-04-23 ENCOUNTER — Ambulatory Visit
Admission: RE | Admit: 2022-04-23 | Discharge: 2022-04-23 | Disposition: A | Discharge status: Home / Self Care | Payer: 59 | Source: Ambulatory Visit | Attending: Physician Assistant | Admitting: Physician Assistant

## 2022-04-23 ENCOUNTER — Telehealth: Payer: Self-pay | Admitting: Family Medicine

## 2022-04-23 ENCOUNTER — Other Ambulatory Visit (HOSPITAL_COMMUNITY): Payer: Self-pay

## 2022-04-23 ENCOUNTER — Other Ambulatory Visit: Payer: Self-pay

## 2022-04-23 VITALS — BP 123/75 | HR 66 | Temp 98.3°F | Resp 18 | Ht 71.0 in | Wt 205.0 lb

## 2022-04-23 DIAGNOSIS — S29011A Strain of muscle and tendon of front wall of thorax, initial encounter: Secondary | ICD-10-CM | POA: Diagnosis not present

## 2022-04-23 DIAGNOSIS — R0789 Other chest pain: Secondary | ICD-10-CM

## 2022-04-23 DIAGNOSIS — M940 Chondrocostal junction syndrome [Tietze]: Secondary | ICD-10-CM | POA: Diagnosis not present

## 2022-04-23 MED ORDER — HYDROCODONE-ACETAMINOPHEN 5-325 MG PO TABS
1.0000 | ORAL_TABLET | Freq: Four times a day (QID) | ORAL | 0 refills | Status: DC | PRN
  Filled 2022-04-23: qty 18, 3d supply, fill #0

## 2022-04-23 MED ORDER — TIZANIDINE HCL 4 MG PO TABS
4.0000 mg | ORAL_TABLET | Freq: Four times a day (QID) | ORAL | 0 refills | Status: DC | PRN
  Filled 2022-04-23: qty 30, 8d supply, fill #0

## 2022-04-23 NOTE — Telephone Encounter (Signed)
[facility] - Client TELEPHONE ADVICE RECORD [facility]urse Patient Name: Eric Blackburn Gender: Male DOB: 09/20/1970 Age: 52 Y 10 M 13 D Return Phone Number: [phone] (Primary) Address: [place]  [address] Client [facility] - Client Client Site [place] Provider Glori Bickers, Roque Lias - MD Contact Type Call Who Is Calling Patient / Member / Family / Caregiver Call Type Triage / Clinical Relationship To Patient Self Return Phone Number [phone] (Primary) Chief Complaint CHEST PAIN - pain, pressure, heaviness or tightness Reason for Call Symptomatic / Request for [place] states he is experiencing chest pain. Translation No Nurse Assessment Nurse: Rolin Barry, RN, Levada Dy Date/Time Eilene Ghazi Time): 04/23/2022 9:38:22 AM Confirm and document reason for call. If symptomatic, describe symptoms. ---Caller states he is experiencing chest pain. Caller advised that sx started on Sunday. Caller states that he is having intercostal pain , has a bruise on the front of chest, pain 8 / 10. Unknown injury. Does the patient have any new or worsening symptoms? ---Yes Will a triage be completed? ---Yes Related visit to physician within the last 2 weeks? ---No Does the PT have any chronic conditions? (i.e. diabetes, asthma, this includes High risk factors for pregnancy, etc.) ---Yes List chronic conditions. ---depression anxiety Is this a behavioral health or substance abuse call? ---No Guidelines Guideline Title Affirmed Question Affirmed Notes Nurse Date/Time (Eastern Time) Chest Pain [1] Chest pain lasts > 5 minutes AND [2] age > 20 Deaton, RN, Levada Dy 04/23/2022 9:40:00 AM Disp. Time Eilene Ghazi Time) Disposition Final User 04/23/2022 9:35:22 AM Send to Urgent Queue Memory Argue 04/23/2022 9:41:25 AM Call EMS 911 Now Yes Deaton, RN,  Levada Dy 04/23/2022 9:42:32 AM 911 Outcome Documentation Deaton, RN, Levada Dy PLEASE NOTE: All timestamps contained within this report are represented as [place] Standard Time. CONFIDENTIALTY NOTICE: This fax transmission is intended only for the addressee. It contains information that is legally privileged, confidential or otherwise protected from use or disclosure. If you are not the intended recipient, you are strictly prohibited from reviewing, disclosing, copying using or disseminating any of this information or taking any action in reliance on or regarding this information. If you have received this fax in error, please notify us immediately by telephone so that we can arrange for its return to [place]. Phone: [phone], Toll-Free: [phone], Fax: [phone] Page: 2 of 2 Call Id: [id number] [place]. Time Eilene Ghazi Time) Disposition Final User Reason: Caller refused. Final Disposition 04/23/2022 9:41:25 AM Call EMS 911 Now Yes Deaton, RN, Cindee Lame Disagree/Comply Disagree Caller Understands Yes PreDisposition Did not know what to do Care Advice Given Per Guideline CALL EMS 911 NOW: * Immediate medical attention is needed. You need to hang up and call 911 (or an ambulance). * Triager Discretion: I'll call you back in a few minutes to be sure you were able to reach them. CARE ADVICE given per Chest Pain (Adult) guideline. Comments User: Saverio Danker, RN Date/Time Eilene Ghazi Time): 04/23/2022 9:45:44 AM Caller refused to call 911. Caller states that he works at [employer] and is wanting to see his MD in the office. Caller advised that he is having chest pain, has a bruise, pain 8 / 10 when breathing. User: Saverio Danker, RN Date/Time Eilene Ghazi Time): 04/23/2022 9:48:19 AM Caller denies any injury, User: Saverio Danker, RN Date/Time Eilene Ghazi Time): 04/23/2022 9:49:25 AM Per the directive, called the office to inform them of the refusal and that caller wants  to be seen at the office, User: Saverio Danker, RN Date/Time Eilene Ghazi Time): 04/23/2022 9:53:34 AM Reached Jeanella Cara at the office and advised her of the refusal

## 2022-04-23 NOTE — ED Provider Notes (Signed)
[facility]    CSN: [id number] Arrival date & time: 04/23/22  1351      History   Chief Complaint Chief Complaint  Patient presents with   Chest Injury    pt exercising on 04/20/22 and now has bruise on upper lt chest with pain. - Entered by patient    HPI Eric Blackburn is a 52 y.o. male.   52 year old male presents with left chest wall pain.  Patient indicates 3 days ago he was working out in the gym and was stretching the left pectoralis muscle when he believes he overly stretched and started having pain and discomfort of the left pectoralis muscle.  Patient indicates that over the past couple days he has had pain left anterior chest wall, pain with breathing, pain with movement of the left shoulder.  He indicates that the pain goes through to the back.  Patient indicates the pain is localized and the midclavicular line at the third-the fourth intercostal space.  He indicates there is pain on palpation of the area.  He indicates that a bruise occurred at that site 2 days ago.  The pain does radiate into the left shoulder and down the left arm.  He is not having any fever, shortness of breath, nausea, or sweating.  He has taken OTC ibuprofen and Tylenol with minimal relief of symptoms.  He is without nausea or vomiting.     Past Medical History:  Diagnosis Date   Anxiety    on meds   Depression    on meds   Fever blister    on meds   Hx of sessile serrated colonic polyps 04/10/2020   Sleep apnea    uses CPAP    Patient Active Problem List   Diagnosis Date Noted   History of alcohol use 01/28/2022   Hx of sessile serrated colonic polyps 04/10/2020   Depressed mood 04/04/2019   Hyperkalemia 12/24/2016   Prostate cancer screening 09/10/2015   Obstructive sleep apnea 05/25/2013   Back pain 02/29/2008   Depression with anxiety 03/31/2007   HYPOSPADIAS 03/31/2007    Past Surgical History:  Procedure Laterality Date   ANTERIOR CRUCIATE LIGAMENT REPAIR  Right 1992   DRUG INDUCED ENDOSCOPY N/A 01/08/2022   Procedure: DRUG INDUCED ENDOSCOPY;  Surgeon: Melida Quitter, MD;  Location: [facility];  Service: ENT;  Laterality: N/A;   HERNIA REPAIR Bilateral    x 2 as a child       Home Medications    Prior to Admission medications   Medication Sig Start Date End Date Taking? Authorizing Provider  buPROPion (WELLBUTRIN XL) 300 MG 24 hr tablet TAKE 1 TABLET BY MOUTH ONCE A DAY 11/05/21  Yes Tower, Marne A, MD  escitalopram (LEXAPRO) 20 MG tablet Take 1 tablet (20 mg total) by mouth daily. 09/27/21  Yes Tower, Wynelle Fanny, MD  HYDROcodone-acetaminophen (NORCO/VICODIN) 5-325 MG tablet Take 1-2 tablets by mouth every 6 (six) hours as needed. 04/23/22  Yes Nyoka Lint, PA-C  ibuprofen (ADVIL) 200 MG tablet Take 400-800 mg by mouth every 6 (six) hours as needed (pain).   Yes [provider]  melatonin 5 MG TABS Take 1 tablet (5 mg total) by mouth at bedtime as needed. 05/21/20  Yes Young, Tarri Fuller D, MD  Multiple Vitamin (MULTIVITAMIN) capsule Take 1 capsule by mouth daily.   Yes [provider]  naltrexone (DEPADE) 50 MG tablet Take 1 tablet (50 mg total) by mouth daily. 04/17/22  Yes Tower, Estero  A, MD  Omega-3 Fatty Acids (FISH OIL) 1000 MG CAPS Take 1 capsule by mouth 2 (two) times daily. 12/26/19  Yes [provider]  oxymetazoline (AFRIN) 0.05 % nasal spray Place 1 spray into both nostrils at bedtime. Alternate nostril   Yes [provider]  tiZANidine (ZANAFLEX) 4 MG tablet Take 1 tablet (4 mg total) by mouth every 6 (six) hours as needed for muscle spasms. 04/23/22  Yes Nyoka Lint, PA-C  Turmeric (QC TUMERIC COMPLEX PO) Take 500 mg by mouth in the morning and at bedtime.   Yes [provider]  valACYclovir (VALTREX) 1000 MG tablet TAKE 2 TABLETS BY MOUTH TWO TIMES A DAY FOR 1 DAY FOR COLD SORE AS NEEDED. 09/24/21  Yes Tower, Wynelle Fanny, MD    Family History Family History  Problem Relation Age of  Onset   Depression Mother    Diabetes type II Mother    Pulmonary Hypertension Mother    Stroke Sister 81       hemorrhagic post procedure   Anxiety disorder Sister    Depression Sister    Asthma Sister        developed as a child   Lung cancer Sister 68       small cell   Emphysema Maternal Grandfather    Prostate cancer Maternal Grandfather    Lymphoma Paternal Grandmother    Colon polyps Neg Hx    Colon cancer Neg Hx    Esophageal cancer Neg Hx    Stomach cancer Neg Hx    Rectal cancer Neg Hx     Social History Social History   Tobacco Use   Smoking status: Never    Passive exposure: Past   Smokeless tobacco: Never  Vaping Use   Vaping Use: Never used  Substance Use Topics   Alcohol use: Not Currently    Alcohol/week: 20.0 standard drinks of alcohol    Types: 20 Standard drinks or equivalent per week    Comment: 1-4 drinks daily (beer)   Drug use: No     Allergies   Patient has no known allergies.   Review of Systems Review of Systems  Respiratory:  Positive for chest tightness.   Cardiovascular:  Positive for chest pain (left anterior chest wall).     Physical Exam Triage Vital Signs ED Triage Vitals  Enc Vitals Group     BP 04/23/22 1421 123/75     Pulse Rate 04/23/22 1421 66     Resp 04/23/22 1421 18     Temp 04/23/22 1421 98.3 F (36.8 C)     Temp Source 04/23/22 1421 Oral     SpO2 04/23/22 1421 93 %     Weight 04/23/22 1420 205 lb (93 kg)     Height 04/23/22 1420 '5\' 11"'$  (1.803 m)     Head Circumference --      Peak Flow --      Pain Score 04/23/22 1420 8     Pain Loc --      Pain Edu? --      Excl. in [place]? --    No data found.  Updated Vital Signs BP 123/75 (BP Location: Left Arm)   Pulse 66   Temp 98.3 F (36.8 C) (Oral)   Resp 18   Ht '5\' 11"'$  (1.803 m)   Wt 205 lb (93 kg)   SpO2 93%   BMI 28.59 kg/m   Visual Acuity Right Eye Distance:   Left Eye Distance:   Bilateral  Distance:    Right Eye Near:   Left Eye Near:     Bilateral Near:     Physical Exam Constitutional:      Appearance: Normal appearance.  Cardiovascular:     Rate and Rhythm: Normal rate and regular rhythm.     Heart sounds: Normal heart sounds.  Pulmonary:     Effort: Pulmonary effort is normal.     Breath sounds: Normal breath sounds and air entry. No wheezing, rhonchi or rales.  Chest:       Comments: Chest: There is a 1 x 1.5 cm fading bruise that is located at the third-fourth medial intercostal space.  There is tenderness on palpation of the area: There is pain on range of motion of the left arm and shoulder, stability is intact and range of motion is normal.  There is no unusual swelling at the site. Abdominal:     General: Abdomen is flat. Bowel sounds are normal.     Palpations: Abdomen is soft.     Tenderness: There is no abdominal tenderness.  Lymphadenopathy:     Cervical: No cervical adenopathy.  Neurological:     Mental Status: He is alert.      UC Treatments / Results  Labs (all labs ordered are listed, but only abnormal results are displayed) Labs Reviewed - No data to display  EKG: Reason: chest pain Symptoms: left anterior chest pain with movement and breathing Comparison: no comparison available Interpretation: Tx: sinus brady at 57/min, normal EKG, no acute changes.   Radiology DG Chest 2 View  Result Date: 04/23/2022 CLINICAL DATA:  Left anterior chest wall pain EXAM: CHEST - 2 VIEW COMPARISON:  Chest x-ray 02/11/2008 FINDINGS: The heart size and mediastinal contours are within normal limits. Both lungs are clear. The visualized skeletal structures are unremarkable. IMPRESSION: No active cardiopulmonary disease. Electronically Signed   By: Ronney Asters M.D.   On: 04/23/2022 15:12    Procedures Procedures (including critical care time)  Medications Ordered in UC Medications - No data to display  Initial Impression / Assessment and Plan / UC Course  I have reviewed the triage vital signs and  the nursing notes.  Pertinent labs & imaging results that were available during my care of the patient were reviewed by me and considered in my medical decision making (see chart for details).    Plan: The diagnosis will be treated with the following: 1.  Chest pain: A.  Advised take Vicodin 5/325 mg, 1-2 every 6-8 hours as needed for pain. B.  Advised to take ibuprofen or Naprosyn every 12 hours for pain relief. 2.  Strain left pectoralis muscle: A.  Advised take Zanaflex 4 mg every 6 hours on a regular basis to help reduce muscle spasm. B.  Advised alternating heat and ice therapy, 10 minutes on and 20 minutes off, 3-4 times throughout the day to help reduce discomfort. 3.  Costochondritis: A.  Advised take Vicodin 5/325 mg, 1-2 every 6-8 hours needed for pain. B.  Advised take ibuprofen or Naprosyn on a prescribed basis to help reduce pain and swelling. 4.  Advised follow-up PCP or return to urgent care if symptoms fail to improve. Final Clinical Impressions(s) / UC Diagnoses   Final diagnoses:  Other chest pain  Strain of left pectoralis muscle, initial encounter  Costochondritis     Discharge Instructions      Advised to continue using ice alternating with heat therapy, 10 minutes on, 20 minutes off, 3-4 times throughout  the day over the next several days to help reduce pain and discomfort. Advised take Zanaflex 4 mg every 6 hours on a regular basis to help decrease muscle spasm and irritability. Advised to take the Vicodin tablets 5/325, 1-2 every 6-8 hours as needed for pain relief.  (You can continue to take Naprosyn or ibuprofen along with taking this medicine to help reduce pain and swelling.)  Advised follow-up PCP or return to urgent care if symptoms fail to improve.    ED Prescriptions     Medication Sig Dispense Auth. Provider   tiZANidine (ZANAFLEX) 4 MG tablet Take 1 tablet (4 mg total) by mouth every 6 (six) hours as needed for muscle spasms. 30 tablet Nyoka Lint, PA-C   HYDROcodone-acetaminophen (NORCO/VICODIN) 5-325 MG tablet Take 1-2 tablets by mouth every 6 (six) hours as needed. 18 tablet Nyoka Lint, PA-C      I have reviewed the PDMP during this encounter.   Nyoka Lint, PA-C 04/23/22 [phone]

## 2022-04-23 NOTE — Telephone Encounter (Signed)
I spoke with pt; pt said on 04/20/22 he had been exercising;no known injury but on 04/20/22 started with CP about 2" below left clavicle in chest (front) and back. Pain is sharp and seems to be worse if takes deep breath; pain level 8. Pt has bruise at same location as pain. Pt said he does not have any SOB. Pt does not feel like this is cardiac related and thinks due to maybe pulled something when exercising. Pt does not think needs to go to ED. Pt wants to know if could be seen or worked in at any [facility] location. Pt is presently at work at [employer]. I spoke with Dr Glori Bickers and she said if pt feels could wait until tomorrow could use warm compresses and schedule with Dr Glori Bickers on 04/24/22 but if pt feels needs to be seen today could schedule appt with another [facility] site or UC. No available appts today or tomorrow that pt could schedule due to work obligations. I scheduled pt appt at [facility] 04/23/22 at 2 PM with UC & ED precautions and pt voiced understanding. Sending note to Dr Glori Bickers and [employer].

## 2022-04-23 NOTE — ED Triage Notes (Signed)
X3 days  Pt states that he has a bruise on his upper chest.  Pt states that he was working out and believed he pulled a muscle. Pt states that he currently has chest pain.

## 2022-04-23 NOTE — Telephone Encounter (Signed)
Access Nurse called stating the pt refused to visit the ER after mentioning he's experiencing chest pain & cannot take deep breaths along with a bruise on chest. Access Nurse stated the pt wants an appt with our office instead, told access nurse we don't have any availability left for today. Access nurse requested our office to contact pt to let him know about our availability. Routing to [employer]

## 2022-04-23 NOTE — Discharge Instructions (Signed)
Advised to continue using ice alternating with heat therapy, 10 minutes on, 20 minutes off, 3-4 times throughout the day over the next several days to help reduce pain and discomfort. Advised take Zanaflex 4 mg every 6 hours on a regular basis to help decrease muscle spasm and irritability. Advised to take the Vicodin tablets 5/325, 1-2 every 6-8 hours as needed for pain relief.  (You can continue to take Naprosyn or ibuprofen along with taking this medicine to help reduce pain and swelling.)  Advised follow-up PCP or return to urgent care if symptoms fail to improve.

## 2022-04-23 NOTE — Telephone Encounter (Signed)
Aware, will watch for correspondence  

## 2022-04-24 ENCOUNTER — Ambulatory Visit: Payer: 59 | Admitting: Nurse Practitioner

## 2022-04-28 DIAGNOSIS — F331 Major depressive disorder, recurrent, moderate: Secondary | ICD-10-CM | POA: Diagnosis not present

## 2022-04-29 DIAGNOSIS — H5213 Myopia, bilateral: Secondary | ICD-10-CM | POA: Diagnosis not present

## 2022-05-01 ENCOUNTER — Other Ambulatory Visit: Payer: Self-pay

## 2022-05-01 DIAGNOSIS — L821 Other seborrheic keratosis: Secondary | ICD-10-CM | POA: Diagnosis not present

## 2022-05-01 DIAGNOSIS — D225 Melanocytic nevi of trunk: Secondary | ICD-10-CM | POA: Diagnosis not present

## 2022-05-08 DIAGNOSIS — G4733 Obstructive sleep apnea (adult) (pediatric): Secondary | ICD-10-CM | POA: Diagnosis not present

## 2022-05-13 DIAGNOSIS — F331 Major depressive disorder, recurrent, moderate: Secondary | ICD-10-CM | POA: Diagnosis not present

## 2022-05-18 NOTE — Progress Notes (Signed)
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 If he does not use CPAP he snores and his wife complains so compliance is usually pretty good.  Download reviewed.  Mostly short nights.  Still bothered by nasal congestion.  Trying to alternate Afrin every other night.  He would like to learn about ENT options for his nose and also discussed the possibility of Inspire.  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  He should be a good candidate.  Still using occasional melatonin.  Still trying on and off CPAP but just does not tolerate it well. CXR 04/23/22- IMPRESSION: No active cardiopulmonary disease.   ROS-see HPI  + = positive Constitutional:   No-   weight loss, night sweats, fevers, chills, fatigue, lassitude. HEENT:   No-  headaches, difficulty swallowing, tooth/dental problems, sore throat,       No-  sneezing, itching, ear ache, + nasal congestion, post  nasal drip,  CV:  No-   chest pain, orthopnea, PND, swelling in lower extremities, anasarca,                                                    dizziness, palpitations Resp: No-   shortness of breath with exertion or at rest.              No-   productive cough,  No non-productive cough,  No- coughing up of blood.              No-   change in color of mucus.  No- wheezing.   Skin: No-   rash or lesions. GI:  No-   heartburn, indigestion, abdominal pain, nausea, vomiting,  GU:  MS:  No-   joint pain or swelling.   Neuro-     nothing unusual Psych:  No- change in mood or affect. + depression or anxiety.  No memory loss.  OBJ- Physical Exam   General- Alert, Oriented, Affect-appropriate, Distress- none acute, medium build Skin- rash-none, lesions- none, excoriation- none Lymphadenopathy- none Head- atraumatic            Eyes- Gross vision intact, PERRLA, conjunctivae and secretions clear            Ears- Hearing, canals-normal            Nose- Clear, no-Septal dev, mucus, polyps, erosion, perforation             Throat- +Small mandible, Mallampati III , mucosa clear , drainage- none, tonsils- atrophic  Neck- flexible , trachea midline, no stridor , thyroid nl, carotid no bruit Chest - symmetrical excursion , unlabored           Heart/CV- RRR , no murmur , no gallop  , no rub, nl s1 s2                           - JVD- none , edema- none, stasis changes- none, varices- none           Lung- clear to P&A, wheeze- none, cough- none , dullness-none, rub- none           Chest wall-  Abd-  Br/ Gen/ Rectal- Not done, not indicated Extrem- cyanosis- none, clubbing, none, atrophy- none, strength- nl Neuro- grossly intact to observation

## 2022-05-20 ENCOUNTER — Encounter: Payer: Self-pay | Admitting: Internal Medicine

## 2022-05-20 ENCOUNTER — Ambulatory Visit (INDEPENDENT_AMBULATORY_CARE_PROVIDER_SITE_OTHER): Payer: Self-pay | Admitting: Internal Medicine

## 2022-05-20 VITALS — BP 118/66 | HR 61 | Ht 71.0 in | Wt 209.4 lb

## 2022-05-20 DIAGNOSIS — G4733 Obstructive sleep apnea (adult) (pediatric): Secondary | ICD-10-CM

## 2022-05-20 NOTE — Patient Instructions (Signed)
We'll see you back per protocol after Inspire implant

## 2022-05-22 ENCOUNTER — Other Ambulatory Visit: Payer: Self-pay

## 2022-05-27 ENCOUNTER — Ambulatory Visit: Payer: 59 | Admitting: Internal Medicine

## 2022-05-27 ENCOUNTER — Other Ambulatory Visit: Payer: Self-pay | Admitting: Otolaryngology

## 2022-06-03 DIAGNOSIS — F331 Major depressive disorder, recurrent, moderate: Secondary | ICD-10-CM | POA: Diagnosis not present

## 2022-06-07 DIAGNOSIS — G4733 Obstructive sleep apnea (adult) (pediatric): Secondary | ICD-10-CM | POA: Diagnosis not present

## 2022-06-24 DIAGNOSIS — F331 Major depressive disorder, recurrent, moderate: Secondary | ICD-10-CM | POA: Diagnosis not present

## 2022-06-27 ENCOUNTER — Other Ambulatory Visit: Payer: Self-pay

## 2022-06-29 NOTE — Assessment & Plan Note (Signed)
We discussed his plans for Inspire implantation. Plan-he will return for Inspire progression per protocol.

## 2022-07-07 DIAGNOSIS — G4733 Obstructive sleep apnea (adult) (pediatric): Secondary | ICD-10-CM | POA: Diagnosis not present

## 2022-07-17 ENCOUNTER — Other Ambulatory Visit (HOSPITAL_COMMUNITY): Payer: Self-pay

## 2022-07-23 NOTE — Pre-Procedure Instructions (Signed)
Surgical Instructions    Your procedure is scheduled on July 30, 2022.  Report to [facility] Main Entrance "A" at 8:50 A.M., then check in with the Admitting office.  Call this number if you have problems the morning of surgery:  [phone]  If you have any questions prior to your surgery date call [phone]: Open Monday-Friday 8am-4pm If you experience any cold or flu symptoms such as cough, fever, chills, shortness of breath, etc. between now and your scheduled surgery, please notify us at the above number.     Remember:  Do not eat after midnight the night before your surgery  You may drink clear liquids until 7:50 AM the morning of your surgery.   Clear liquids allowed are: Water, Non-Citrus Juices (without pulp), Carbonated Beverages, Clear Tea, Black Coffee Only (NO MILK, CREAM OR POWDERED CREAMER of any kind), and Gatorade.     Take these medicines the morning of surgery with A SIP OF WATER:  buPROPion (WELLBUTRIN XL)   escitalopram (LEXAPRO)   valACYclovir (VALTREX) - may take if needed    Contact your prescribing physician for instructions on if/when to stop taking your naltrexone (DEPADE).   As of today, STOP taking any Aspirin (unless otherwise instructed by your surgeon) Aleve, Naproxen, Ibuprofen, Motrin, Advil, Goody's, BC's, all herbal medications, fish oil, and all vitamins.                     Do NOT Smoke (Tobacco/Vaping) for 24 hours prior to your procedure.  If you use a CPAP at night, you may bring your mask/headgear for your overnight stay.   Contacts, glasses, piercing's, hearing aid's, dentures or partials may not be worn into surgery, please bring cases for these belongings.    For patients admitted to the hospital, discharge time will be determined by your treatment team.   Patients discharged the day of surgery will not be allowed to drive home, and someone needs to stay with them for 24 hours.  SURGICAL WAITING ROOM VISITATION Patients having  surgery or a procedure may have no more than 2 support people in the waiting area - these visitors may rotate.   Children under the age of 73 must have an adult with them who is not the patient. If the patient needs to stay at the hospital during part of their recovery, the visitor guidelines for inpatient rooms apply. Pre-op nurse will coordinate an appropriate time for 1 support person to accompany patient in pre-op.  This support person may not rotate.   Please refer to the [facility] website for the visitor guidelines for Inpatients (after your surgery is over and you are in a regular room).    Special instructions:   [place]- Preparing For Surgery  Before surgery, you can play an important role. Because skin is not sterile, your skin needs to be as free of germs as possible. You can reduce the number of germs on your skin by washing with CHG (chlorahexidine gluconate) Soap before surgery.  CHG is an antiseptic cleaner which kills germs and bonds with the skin to continue killing germs even after washing.    Oral Hygiene is also important to reduce your risk of infection.  Remember - BRUSH YOUR TEETH THE MORNING OF SURGERY WITH YOUR REGULAR TOOTHPASTE  Please do not use if you have an allergy to CHG or antibacterial soaps. If your skin becomes reddened/irritated stop using the CHG.  Do not shave (including legs and underarms) for at  least 48 hours prior to first CHG shower. It is OK to shave your face.  Please follow these instructions carefully.   Shower the NIGHT BEFORE SURGERY and the MORNING OF SURGERY  If you chose to wash your hair, wash your hair first as usual with your normal shampoo.  After you shampoo, rinse your hair and body thoroughly to remove the shampoo.  Use CHG Soap as you would any other liquid soap. You can apply CHG directly to the skin and wash gently with a scrungie or a clean washcloth.   Apply the CHG Soap to your body ONLY FROM THE NECK DOWN.  Do not use  on open wounds or open sores. Avoid contact with your eyes, ears, mouth and genitals (private parts). Wash Face and genitals (private parts)  with your normal soap.   Wash thoroughly, paying special attention to the area where your surgery will be performed.  Thoroughly rinse your body with warm water from the neck down.  DO NOT shower/wash with your normal soap after using and rinsing off the CHG Soap.  Pat yourself dry with a CLEAN TOWEL.  Wear CLEAN PAJAMAS to bed the night before surgery  Place CLEAN SHEETS on your bed the night before your surgery  DO NOT SLEEP WITH PETS.   Day of Surgery: Take a shower with CHG soap. Do not wear jewelry or makeup Do not wear lotions, powders, perfumes/colognes, or deodorant. Do not shave 48 hours prior to surgery.  Men may shave face and neck. Do not bring valuables to the hospital.  [facility] is not responsible for any belongings or valuables. Do not wear nail polish, gel polish, artificial nails, or any other type of covering on natural nails (fingers and toes) If you have artificial nails or gel coating that need to be removed by a nail salon, please have this removed prior to surgery. Artificial nails or gel coating may interfere with anesthesia's ability to adequately monitor your vital signs.  Wear Clean/Comfortable clothing the morning of surgery Remember to brush your teeth WITH YOUR REGULAR TOOTHPASTE.   Please read over the following fact sheets that you were given.    If you received a COVID test during your pre-op visit  it is requested that you wear a mask when out in public, stay away from anyone that may not be feeling well and notify your surgeon if you develop symptoms. If you have been in contact with anyone that has tested positive in the last 10 days please notify you surgeon.

## 2022-07-24 ENCOUNTER — Encounter: Payer: Self-pay | Admitting: Family Medicine

## 2022-07-24 ENCOUNTER — Encounter (HOSPITAL_COMMUNITY): Payer: Self-pay

## 2022-07-24 ENCOUNTER — Encounter (HOSPITAL_COMMUNITY)
Admission: RE | Admit: 2022-07-24 | Discharge: 2022-07-24 | Disposition: A | Discharge status: Home / Self Care | Payer: 59 | Source: Ambulatory Visit | Attending: Otolaryngology | Admitting: Otolaryngology

## 2022-07-24 ENCOUNTER — Telehealth: Payer: Self-pay | Admitting: *Deleted

## 2022-07-24 ENCOUNTER — Other Ambulatory Visit: Payer: Self-pay

## 2022-07-24 VITALS — BP 121/77 | HR 64 | Temp 97.9°F | Resp 18 | Ht 71.0 in | Wt 205.7 lb

## 2022-07-24 DIAGNOSIS — Z01812 Encounter for preprocedural laboratory examination: Secondary | ICD-10-CM | POA: Insufficient documentation

## 2022-07-24 DIAGNOSIS — Z01818 Encounter for other preprocedural examination: Secondary | ICD-10-CM

## 2022-07-24 HISTORY — DX: Other psychoactive substance abuse, uncomplicated: F19.10

## 2022-07-24 LAB — CBC
HCT: 44 % (ref 39.0–52.0)
Hemoglobin: 14.3 g/dL (ref 13.0–17.0)
MCH: 30.3 pg (ref 26.0–34.0)
MCHC: 32.5 g/dL (ref 30.0–36.0)
MCV: 93.2 fL (ref 80.0–100.0)
Platelets: 242 10*3/uL (ref 150–400)
RBC: 4.72 MIL/uL (ref 4.22–5.81)
RDW: 12.3 % (ref 11.5–15.5)
WBC: 6 10*3/uL (ref 4.0–10.5)
nRBC: 0 % (ref 0.0–0.2)

## 2022-07-24 NOTE — Telephone Encounter (Signed)
Pt sent a message saying:  Presurgical recommended I hold naltrexone x 3 days preop but to check with you. Still taking 50mg  daily and drinking only 1 or 2 beers a week. I'll start holding it tomorrow, 6 days prior.

## 2022-07-24 NOTE — Telephone Encounter (Signed)
Sent mychart message letting pt know 

## 2022-07-24 NOTE — Progress Notes (Signed)
PCP - Dr. Idamae Schuller A. Marketing executive - Denies  PPM/ICD - Denies Device Orders - n/a Rep Notified - n/a  Chest x-ray - 04/23/2022 EKG - 04/23/2022 Stress Test - Denies ECHO - Denies Cardiac Cath - Denies  Sleep Study - +OSA. Wears CPAP nightly. Pt is not aware of his pressure settings.  No DM  Last dose of GLP1 agonist- n/a GLP1 instructions: n/a  Blood Thinner Instructions: n/a Aspirin Instructions: n/a  ERAS Protcol - Clear liquids until 0750 morning of surgery PRE-SURGERY Ensure or G2- n/a  COVID TEST- n/a   Anesthesia review: No. Pt instructed to hold his naltrexone (Depade) 3 days prior to surgery and to also consult with prescribing physician for additional instructions/recommendations.  Patient denies shortness of breath, fever, cough and chest pain at PAT appointment. Pt denies any respiratory illness/infection in the last two months.   All instructions explained to the patient, with a verbal understanding of the material. Patient agrees to go over the instructions while at home for a better understanding. Patient also instructed to self quarantine after being tested for COVID-19. The opportunity to ask questions was provided.

## 2022-07-24 NOTE — Telephone Encounter (Signed)
Converted to a phone note and sent to PCP

## 2022-07-24 NOTE — Telephone Encounter (Signed)
That sounds fine, I don't think that will be a problem

## 2022-07-28 ENCOUNTER — Other Ambulatory Visit: Payer: Self-pay | Admitting: Family Medicine

## 2022-07-28 MED ORDER — BUPROPION HCL ER (XL) 300 MG PO TB24
300.0000 mg | ORAL_TABLET | Freq: Every day | ORAL | 2 refills | Status: DC
  Filled 2022-07-28: qty 90, 90d supply, fill #0
  Filled 2022-10-22: qty 90, 90d supply, fill #1
  Filled 2023-02-01: qty 90, 90d supply, fill #2

## 2022-07-29 ENCOUNTER — Other Ambulatory Visit: Payer: Self-pay

## 2022-07-30 ENCOUNTER — Ambulatory Visit (HOSPITAL_BASED_OUTPATIENT_CLINIC_OR_DEPARTMENT_OTHER): Payer: 59 | Admitting: Anesthesiology

## 2022-07-30 ENCOUNTER — Other Ambulatory Visit: Payer: Self-pay

## 2022-07-30 ENCOUNTER — Ambulatory Visit (HOSPITAL_COMMUNITY)
Admission: RE | Admit: 2022-07-30 | Discharge: 2022-07-30 | Disposition: A | Discharge status: Home / Self Care | Payer: 59 | Attending: Otolaryngology | Admitting: Otolaryngology

## 2022-07-30 ENCOUNTER — Ambulatory Visit (HOSPITAL_COMMUNITY): Payer: 59

## 2022-07-30 ENCOUNTER — Ambulatory Visit (HOSPITAL_COMMUNITY): Payer: 59 | Admitting: Anesthesiology

## 2022-07-30 ENCOUNTER — Encounter (HOSPITAL_COMMUNITY): Payer: Self-pay | Admitting: Otolaryngology

## 2022-07-30 ENCOUNTER — Encounter (HOSPITAL_COMMUNITY)
Admission: RE | Disposition: A | Discharge status: Home / Self Care | Payer: Self-pay | Source: Home / Self Care | Attending: Otolaryngology

## 2022-07-30 ENCOUNTER — Other Ambulatory Visit (HOSPITAL_COMMUNITY): Payer: Self-pay

## 2022-07-30 DIAGNOSIS — F418 Other specified anxiety disorders: Secondary | ICD-10-CM

## 2022-07-30 DIAGNOSIS — G4733 Obstructive sleep apnea (adult) (pediatric): Secondary | ICD-10-CM

## 2022-07-30 DIAGNOSIS — Z79899 Other long term (current) drug therapy: Secondary | ICD-10-CM | POA: Diagnosis not present

## 2022-07-30 DIAGNOSIS — Z01818 Encounter for other preprocedural examination: Secondary | ICD-10-CM | POA: Diagnosis not present

## 2022-07-30 DIAGNOSIS — F419 Anxiety disorder, unspecified: Secondary | ICD-10-CM | POA: Diagnosis not present

## 2022-07-30 DIAGNOSIS — Z462 Encounter for fitting and adjustment of other devices related to nervous system and special senses: Secondary | ICD-10-CM | POA: Diagnosis not present

## 2022-07-30 DIAGNOSIS — F32A Depression, unspecified: Secondary | ICD-10-CM | POA: Diagnosis not present

## 2022-07-30 DIAGNOSIS — G4730 Sleep apnea, unspecified: Secondary | ICD-10-CM | POA: Diagnosis not present

## 2022-07-30 DIAGNOSIS — Z801 Family history of malignant neoplasm of trachea, bronchus and lung: Secondary | ICD-10-CM | POA: Insufficient documentation

## 2022-07-30 DIAGNOSIS — Z9682 Presence of neurostimulator: Secondary | ICD-10-CM | POA: Diagnosis not present

## 2022-07-30 DIAGNOSIS — Z9989 Dependence on other enabling machines and devices: Secondary | ICD-10-CM | POA: Diagnosis not present

## 2022-07-30 HISTORY — PX: IMPLANTATION OF HYPOGLOSSAL NERVE STIMULATOR: SHX6827

## 2022-07-30 SURGERY — INSERTION, HYPOGLOSSAL NERVE STIMULATOR
Anesthesia: General | Site: Neck

## 2022-07-30 MED ORDER — MIDAZOLAM HCL 2 MG/2ML IJ SOLN
INTRAMUSCULAR | Status: DC | PRN
  Administered 2022-07-30: 2 mg via INTRAVENOUS

## 2022-07-30 MED ORDER — KETOROLAC TROMETHAMINE 30 MG/ML IJ SOLN
INTRAMUSCULAR | Status: AC
  Administered 2022-07-30: 30 mg via INTRAVENOUS
  Filled 2022-07-30: qty 1

## 2022-07-30 MED ORDER — OXYCODONE HCL 5 MG PO TABS
ORAL_TABLET | ORAL | Status: AC
  Administered 2022-07-30: 5 mg via ORAL
  Filled 2022-07-30: qty 1

## 2022-07-30 MED ORDER — CEFAZOLIN SODIUM-DEXTROSE 2-4 GM/100ML-% IV SOLN
2.0000 g | INTRAVENOUS | Status: AC
  Administered 2022-07-30: 2 g via INTRAVENOUS

## 2022-07-30 MED ORDER — KETOROLAC TROMETHAMINE 30 MG/ML IJ SOLN: 30.0000 mg | Freq: Once | INTRAMUSCULAR | Status: AC

## 2022-07-30 MED ORDER — OXYCODONE HCL 5 MG PO TABS: 5.0000 mg | ORAL_TABLET | Freq: Once | ORAL | Status: AC | PRN

## 2022-07-30 MED ORDER — ONDANSETRON HCL 4 MG/2ML IJ SOLN
INTRAMUSCULAR | Status: AC
  Filled 2022-07-30: qty 2

## 2022-07-30 MED ORDER — ACETAMINOPHEN 500 MG PO TABS: 1000.0000 mg | ORAL_TABLET | Freq: Once | ORAL | Status: AC

## 2022-07-30 MED ORDER — PHENYLEPHRINE HCL-NACL 20-0.9 MG/250ML-% IV SOLN
INTRAVENOUS | Status: DC | PRN
  Administered 2022-07-30: 25 ug/min via INTRAVENOUS

## 2022-07-30 MED ORDER — ACETAMINOPHEN 500 MG PO TABS
ORAL_TABLET | ORAL | Status: AC
  Administered 2022-07-30: 1000 mg via ORAL
  Filled 2022-07-30: qty 2

## 2022-07-30 MED ORDER — MIDAZOLAM HCL 2 MG/2ML IJ SOLN
INTRAMUSCULAR | Status: AC
  Filled 2022-07-30: qty 2

## 2022-07-30 MED ORDER — LIDOCAINE 2% (20 MG/ML) 5 ML SYRINGE
INTRAMUSCULAR | Status: DC | PRN
  Administered 2022-07-30: 60 mg via INTRAVENOUS
  Administered 2022-07-30: 40 mg via INTRAVENOUS

## 2022-07-30 MED ORDER — FENTANYL CITRATE (PF) 250 MCG/5ML IJ SOLN
INTRAMUSCULAR | Status: DC | PRN
  Administered 2022-07-30: 50 ug via INTRAVENOUS
  Administered 2022-07-30: 25 ug via INTRAVENOUS
  Administered 2022-07-30: 100 ug via INTRAVENOUS

## 2022-07-30 MED ORDER — SUCCINYLCHOLINE 20MG/ML (10ML) SYRINGE FOR MEDFUSION PUMP - OPTIME
INTRAMUSCULAR | Status: DC | PRN
  Administered 2022-07-30: 20 mg via INTRAVENOUS

## 2022-07-30 MED ORDER — FENTANYL CITRATE (PF) 100 MCG/2ML IJ SOLN: 25.0000 ug | INTRAMUSCULAR | Status: DC | PRN

## 2022-07-30 MED ORDER — 0.9 % SODIUM CHLORIDE (POUR BTL) OPTIME
TOPICAL | Status: DC | PRN
  Administered 2022-07-30: 1000 mL

## 2022-07-30 MED ORDER — OXYCODONE HCL 5 MG/5ML PO SOLN: 5.0000 mg | Freq: Once | ORAL | Status: AC | PRN

## 2022-07-30 MED ORDER — PROPOFOL 10 MG/ML IV BOLUS
INTRAVENOUS | Status: AC
  Filled 2022-07-30: qty 20

## 2022-07-30 MED ORDER — ACETAMINOPHEN 10 MG/ML IV SOLN: 1000.0000 mg | Freq: Four times a day (QID) | INTRAVENOUS | Status: DC

## 2022-07-30 MED ORDER — DEXAMETHASONE SODIUM PHOSPHATE 10 MG/ML IJ SOLN
INTRAMUSCULAR | Status: DC | PRN
  Administered 2022-07-30: 10 mg via INTRAVENOUS

## 2022-07-30 MED ORDER — PROPOFOL 10 MG/ML IV BOLUS
INTRAVENOUS | Status: DC | PRN
  Administered 2022-07-30: 200 mg via INTRAVENOUS

## 2022-07-30 MED ORDER — ORAL CARE MOUTH RINSE: 15.0000 mL | Freq: Once | OROMUCOSAL | Status: AC

## 2022-07-30 MED ORDER — LACTATED RINGERS IV SOLN: INTRAVENOUS | Status: DC

## 2022-07-30 MED ORDER — CHLORHEXIDINE GLUCONATE 0.12 % MT SOLN
OROMUCOSAL | Status: AC
  Administered 2022-07-30: 15 mL via OROMUCOSAL
  Filled 2022-07-30: qty 15

## 2022-07-30 MED ORDER — DEXAMETHASONE SODIUM PHOSPHATE 10 MG/ML IJ SOLN
INTRAMUSCULAR | Status: AC
  Filled 2022-07-30: qty 1

## 2022-07-30 MED ORDER — LIDOCAINE 2% (20 MG/ML) 5 ML SYRINGE
INTRAMUSCULAR | Status: AC
  Filled 2022-07-30: qty 5

## 2022-07-30 MED ORDER — LIDOCAINE-EPINEPHRINE 1 %-1:100000 IJ SOLN
INTRAMUSCULAR | Status: AC
  Filled 2022-07-30: qty 1

## 2022-07-30 MED ORDER — LIDOCAINE-EPINEPHRINE 1 %-1:100000 IJ SOLN
INTRAMUSCULAR | Status: DC | PRN
  Administered 2022-07-30: 6 mL

## 2022-07-30 MED ORDER — CEFAZOLIN SODIUM-DEXTROSE 2-4 GM/100ML-% IV SOLN
INTRAVENOUS | Status: AC
  Filled 2022-07-30: qty 100

## 2022-07-30 MED ORDER — ONDANSETRON HCL 4 MG/2ML IJ SOLN
INTRAMUSCULAR | Status: DC | PRN
  Administered 2022-07-30: 4 mg via INTRAVENOUS

## 2022-07-30 MED ORDER — PROMETHAZINE HCL 25 MG/ML IJ SOLN: 6.2500 mg | INTRAMUSCULAR | Status: DC | PRN

## 2022-07-30 MED ORDER — FENTANYL CITRATE (PF) 250 MCG/5ML IJ SOLN
INTRAMUSCULAR | Status: AC
  Filled 2022-07-30: qty 5

## 2022-07-30 MED ORDER — ACETAMINOPHEN 10 MG/ML IV SOLN
INTRAVENOUS | Status: AC
  Administered 2022-07-30: 1000 mg
  Filled 2022-07-30: qty 100

## 2022-07-30 MED ORDER — CHLORHEXIDINE GLUCONATE 0.12 % MT SOLN: 15.0000 mL | Freq: Once | OROMUCOSAL | Status: AC

## 2022-07-30 MED ORDER — FENTANYL CITRATE (PF) 100 MCG/2ML IJ SOLN
INTRAMUSCULAR | Status: AC
  Administered 2022-07-30: 25 ug via INTRAVENOUS
  Filled 2022-07-30: qty 2

## 2022-07-30 MED ORDER — HYDROCODONE-ACETAMINOPHEN 5-325 MG PO TABS
1.0000 | ORAL_TABLET | Freq: Four times a day (QID) | ORAL | 0 refills | Status: DC | PRN
  Filled 2022-07-30 (×2): qty 12, 2d supply, fill #0

## 2022-07-30 SURGICAL SUPPLY — 72 items
ACC NRSTM 4 TRQ WRNCH STRL (MISCELLANEOUS) ×1
ADH SKN CLS APL DERMABOND .7 (GAUZE/BANDAGES/DRESSINGS) ×1
BAG COUNTER SPONGE SURGICOUNT (BAG) ×1 IMPLANT
BAG SPNG CNTER NS LX DISP (BAG)
BLADE CLIPPER SURG (BLADE) IMPLANT
BLADE SURG 15 STRL LF DISP TIS (BLADE) ×3 IMPLANT
BLADE SURG 15 STRL SS (BLADE) ×1
CANISTER SUCT 3000ML PPV (MISCELLANEOUS) ×1 IMPLANT
CORD BIPOLAR FORCEPS 12FT (ELECTRODE) ×1 IMPLANT
COVER PROBE W GEL 5X96 (DRAPES) ×1 IMPLANT
COVER SURGICAL LIGHT HANDLE (MISCELLANEOUS) ×1 IMPLANT
DERMABOND ADVANCED .7 DNX12 (GAUZE/BANDAGES/DRESSINGS) ×2 IMPLANT
DRAPE C-ARM 35X43 STRL (DRAPES) ×1 IMPLANT
DRAPE HEAD BAR (DRAPES) ×1 IMPLANT
DRAPE INCISE IOBAN 66X45 STRL (DRAPES) ×1 IMPLANT
DRAPE MICROSCOPE LEICA 54X105 (DRAPES) ×1 IMPLANT
DRAPE UTILITY XL STRL (DRAPES) ×1 IMPLANT
DRSG TEGADERM 4X4.75 (GAUZE/BANDAGES/DRESSINGS) ×3 IMPLANT
ELECT COATED BLADE 2.86 ST (ELECTRODE) ×1 IMPLANT
ELECT EMG 18 NIMS (NEUROSURGERY SUPPLIES) ×1
ELECT REM PT RETURN 9FT ADLT (ELECTROSURGICAL) ×1
ELECTRODE EMG 18 NIMS (NEUROSURGERY SUPPLIES) ×1 IMPLANT
ELECTRODE REM PT RTRN 9FT ADLT (ELECTROSURGICAL) ×1 IMPLANT
FORCEPS BIPOLAR SPETZLER 8 1.0 (NEUROSURGERY SUPPLIES) ×1 IMPLANT
GAUZE 4X4 16PLY ~~LOC~~+RFID DBL (SPONGE) ×1 IMPLANT
GAUZE SPONGE 4X4 12PLY STRL (GAUZE/BANDAGES/DRESSINGS) ×1 IMPLANT
GENERATOR PULSE INSPIRE (Generator) ×1 IMPLANT
GENERATOR PULSE INSPIRE IV (Generator) ×1 IMPLANT
GLOVE BIO SURGEON STRL SZ 6.5 (GLOVE) IMPLANT
GLOVE BIO SURGEON STRL SZ7.5 (GLOVE) ×1 IMPLANT
GOWN STRL REUS W/ TWL LRG LVL3 (GOWN DISPOSABLE) ×3 IMPLANT
GOWN STRL REUS W/TWL LRG LVL3 (GOWN DISPOSABLE) ×3
IV CATH 18GX1.25 SAFE RETR GRN (IV SOLUTION) IMPLANT
KIT BASIN OR (CUSTOM PROCEDURE TRAY) ×1 IMPLANT
KIT NEURO ACCESSORY W/WRENCH (MISCELLANEOUS) IMPLANT
KIT TURNOVER KIT B (KITS) ×1 IMPLANT
LEAD SENSING RESP INSPIRE (Lead) ×1 IMPLANT
LEAD SENSING RESP INSPIRE IV (Lead) ×1 IMPLANT
LEAD SLEEP STIM INSPIRE IV/V (Lead) ×1 IMPLANT
LEAD SLEEP STIMULATION INSPIRE (Lead) ×1 IMPLANT
LOOP VASCLR MAXI BLUE 18IN ST (MISCELLANEOUS) ×1 IMPLANT
LOOP VASCULAR MAXI 18 BLUE (MISCELLANEOUS) ×1
LOOP VASCULAR MINI 18 RED (MISCELLANEOUS) ×1
LOOPS VASCLR MAXI BLUE 18IN ST (MISCELLANEOUS) ×1 IMPLANT
MARKER SKIN DUAL TIP RULER LAB (MISCELLANEOUS) ×2 IMPLANT
NDL HYPO 25GX1X1/2 BEV (NEEDLE) ×1 IMPLANT
NEEDLE HYPO 25GX1X1/2 BEV (NEEDLE) ×1 IMPLANT
NS IRRIG 1000ML POUR BTL (IV SOLUTION) ×1 IMPLANT
PAD ARMBOARD 7.5X6 YLW CONV (MISCELLANEOUS) ×1 IMPLANT
PASSER CATH 38CM DISP (INSTRUMENTS) ×1 IMPLANT
PENCIL SMOKE EVACUATOR (MISCELLANEOUS) ×1 IMPLANT
POSITIONER HEAD DONUT 9IN (MISCELLANEOUS) ×1 IMPLANT
PROBE NERVE STIMULATOR (NEUROSURGERY SUPPLIES) ×1 IMPLANT
REMOTE CONTROL SLEEP INSPIRE (MISCELLANEOUS) ×1 IMPLANT
SET WALTER ACTIVATION W/DRAPE (SET/KITS/TRAYS/PACK) ×1 IMPLANT
SPONGE INTESTINAL PEANUT (DISPOSABLE) ×1 IMPLANT
STAPLER VISISTAT 35W (STAPLE) ×1 IMPLANT
SUT SILK 2 0 SH (SUTURE) ×1 IMPLANT
SUT SILK 3 0 REEL (SUTURE) ×1 IMPLANT
SUT SILK 3 0 SH 30 (SUTURE) ×2 IMPLANT
SUT SILK 3-0 (SUTURE) ×1
SUT SILK 3-0 RB1 30XBRD (SUTURE) ×1
SUT VIC AB 3-0 SH 27 (SUTURE) ×2
SUT VIC AB 3-0 SH 27X BRD (SUTURE) ×2 IMPLANT
SUT VIC AB 4-0 PS2 27 (SUTURE) ×2 IMPLANT
SUTURE SILK 3-0 RB1 30XBRD (SUTURE) ×1 IMPLANT
SYR 10ML LL (SYRINGE) ×1 IMPLANT
TAPE CLOTH SURG 4X10 WHT LF (GAUZE/BANDAGES/DRESSINGS) ×1 IMPLANT
TOWEL GREEN STERILE (TOWEL DISPOSABLE) ×1 IMPLANT
TRAY ENT MC OR (CUSTOM PROCEDURE TRAY) ×1 IMPLANT
VASCULAR TIE MAXI BLUE 18IN ST (MISCELLANEOUS) ×1
VASCULAR TIE MINI RED 18IN STL (MISCELLANEOUS) ×1 IMPLANT

## 2022-07-30 NOTE — H&P (Signed)
Eric Blackburn is an 52 y.o. male.   Chief Complaint: Sleep apnea HPI: 52 year old male with sleep apnea who has been unable to tolerate CPAP.  Past Medical History:  Diagnosis Date   Depression    on meds   Fever blister    on meds   Hx of sessile serrated colonic polyps 04/10/2020   Sleep apnea    uses CPAP   Substance abuse (HCC)    Hx ETOH    Past Surgical History:  Procedure Laterality Date   ANTERIOR CRUCIATE LIGAMENT REPAIR Right 1992   COLONOSCOPY  2022   DRUG INDUCED ENDOSCOPY N/A 01/08/2022   Procedure: DRUG INDUCED ENDOSCOPY;  Surgeon: Christia Reading, MD;  Location: [facility];  Service: ENT;  Laterality: N/A;   HERNIA REPAIR Bilateral    x 2 as a child    Family History  Problem Relation Age of Onset   Depression Mother    Diabetes type II Mother    Pulmonary Hypertension Mother    Stroke Sister 69       hemorrhagic post procedure   Anxiety disorder Sister    Depression Sister    Asthma Sister        developed as a child   Lung cancer Sister 30       small cell   Emphysema Maternal Grandfather    Prostate cancer Maternal Grandfather    Lymphoma Paternal Grandmother    Colon polyps Neg Hx    Colon cancer Neg Hx    Esophageal cancer Neg Hx    Stomach cancer Neg Hx    Rectal cancer Neg Hx    Social History:  reports that he has never smoked. He has been exposed to tobacco smoke. He has never used smokeless tobacco. He reports that he does not currently use alcohol after a past usage of about 1.0 standard drink of alcohol per week. He reports that he does not use drugs.  Allergies: No Known Allergies  Medications Prior to Admission  Medication Sig Dispense Refill   buPROPion (WELLBUTRIN XL) 300 MG 24 hr tablet Take 1 tablet (300 mg total) by mouth daily. 90 tablet 2   escitalopram (LEXAPRO) 20 MG tablet Take 1 tablet (20 mg total) by mouth daily. 90 tablet 3   melatonin 5 MG TABS Take 1 tablet (5 mg total) by mouth at bedtime as  needed. 30 tablet 12   naltrexone (DEPADE) 50 MG tablet Take 1 tablet (50 mg total) by mouth daily. 30 tablet 2   oxymetazoline (AFRIN) 0.05 % nasal spray Place 1 spray into both nostrils at bedtime. Alternate nostril     Multiple Vitamin (MULTIVITAMIN) capsule Take 1 capsule by mouth daily.     Omega-3 Fatty Acids (FISH OIL) 1000 MG CAPS Take 1 capsule by mouth 2 (two) times daily.     Turmeric (QC TUMERIC COMPLEX PO) Take 500 mg by mouth in the morning and at bedtime.     valACYclovir (VALTREX) 1000 MG tablet TAKE 2 TABLETS BY MOUTH TWO TIMES A DAY FOR 1 DAY FOR COLD SORE AS NEEDED. 4 tablet 5    No results found for this or any previous visit (from the past 48 hour(s)). No results found.  Review of Systems  All other systems reviewed and are negative.   Blood pressure 123/85, pulse 60, temperature 97.6 F (36.4 C), resp. rate 18, height 5\' 11"  (1.803 m), weight 93 kg, SpO2 96 %. Physical Exam Constitutional:  Appearance: Normal appearance. He is normal weight.  HENT:     Head: Normocephalic and atraumatic.     Right Ear: External ear normal.     Left Ear: External ear normal.     Nose: Nose normal.     Mouth/Throat:     Mouth: Mucous membranes are moist.     Pharynx: Oropharynx is clear.  Eyes:     Extraocular Movements: Extraocular movements intact.     Conjunctiva/sclera: Conjunctivae normal.     Pupils: Pupils are equal, round, and reactive to light.  Cardiovascular:     Rate and Rhythm: Normal rate.  Pulmonary:     Effort: Pulmonary effort is normal.  Musculoskeletal:     Cervical back: Normal range of motion.  Skin:    General: Skin is warm and dry.  Neurological:     General: No focal deficit present.     Mental Status: He is alert and oriented to person, place, and time.  Psychiatric:        Mood and Affect: Mood normal.        Behavior: Behavior normal.        Thought Content: Thought content normal.        Judgment: Judgment normal.       Assessment/Plan Obstructive sleep apnea and BMI 28.59.  To OR for hypoglossal nerve stimulator placement.  Christia Reading, MD 07/30/2022, 11:25 AM

## 2022-07-30 NOTE — Transfer of Care (Signed)
Immediate Anesthesia Transfer of Care Note  Patient: Eric Blackburn  Procedure(s) Performed: IMPLANTATION OF HYPOGLOSSAL NERVE STIMULATOR (Neck)  Patient Location: PACU  Anesthesia Type:General  Level of Consciousness: awake, alert , and drowsy  Airway & Oxygen Therapy: Patient Spontanous Breathing  Post-op Assessment: Report given to RN, Post -op Vital signs reviewed and stable, and Patient moving all extremities X 4  Post vital signs: Reviewed and stable  Last Vitals:  Vitals Value Taken Time  BP 130/80   Temp    Pulse 72   Resp    SpO2 98     Last Pain:  Vitals:   07/30/22 0927  PainSc: 0-No pain         Complications: No notable events documented.

## 2022-07-30 NOTE — Op Note (Signed)

## 2022-07-30 NOTE — Anesthesia Postprocedure Evaluation (Signed)
Anesthesia Post Note  Patient: Eric Blackburn  Procedure(s) Performed: IMPLANTATION OF HYPOGLOSSAL NERVE STIMULATOR (Neck)     Patient location during evaluation: PACU Anesthesia Type: General Level of consciousness: awake and alert Pain management: pain level controlled Vital Signs Assessment: post-procedure vital signs reviewed and stable Respiratory status: spontaneous breathing, nonlabored ventilation and respiratory function stable Cardiovascular status: blood pressure returned to baseline and stable Postop Assessment: no apparent nausea or vomiting Anesthetic complications: no   No notable events documented.  Last Vitals:  Vitals:   07/30/22 1430 07/30/22 1445  BP: 130/80 132/78  Pulse: 68 65  Resp: 17 11  Temp:    SpO2: 97% 94%                   Beryle Lathe

## 2022-07-30 NOTE — Brief Op Note (Signed)
07/30/2022  2:06 PM  PATIENT:  Eric Blackburn  52 y.o. male  PRE-OPERATIVE DIAGNOSIS:  Obstructive sleep apnea adult pediatric  POST-OPERATIVE DIAGNOSIS:  Obstructive sleep apnea adult pediatric  PROCEDURE:  Procedure(s): IMPLANTATION OF HYPOGLOSSAL NERVE STIMULATOR (N/A)  SURGEON:  Surgeon(s) and Role:    Christia Reading, MD - Primary  PHYSICIAN ASSISTANT:   ASSISTANTS: RNFA   ANESTHESIA:   general  EBL:  Minimal   BLOOD ADMINISTERED:none  DRAINS: none   LOCAL MEDICATIONS USED:  LIDOCAINE   SPECIMEN:  No Specimen  DISPOSITION OF SPECIMEN:  N/A  COUNTS:  YES  TOURNIQUET:  * No tourniquets in log *  DICTATION: .Note written in EPIC  PLAN OF CARE: Discharge to home after PACU  PATIENT DISPOSITION:  PACU - hemodynamically stable.   Delay start of Pharmacological VTE agent (>24hrs) due to surgical blood loss or risk of bleeding: no

## 2022-07-30 NOTE — Anesthesia Procedure Notes (Addendum)
Procedure Name: Intubation Date/Time: 07/30/2022 12:12 PM  Performed by: Aundria Rud, CRNAPre-anesthesia Checklist: Patient identified, Emergency Drugs available, Suction available and Patient being monitored Patient Re-evaluated:Patient Re-evaluated prior to induction Oxygen Delivery Method: Circle System Utilized Preoxygenation: Pre-oxygenation with 100% oxygen Induction Type: IV induction Ventilation: Mask ventilation without difficulty Laryngoscope Size: Mac and 4 Grade View: Grade I Tube type: Oral Tube size: 7.5 mm Number of attempts: 1 Airway Equipment and Method: Stylet and Oral airway Placement Confirmation: ETT inserted through vocal cords under direct vision, positive ETCO2 and breath sounds checked- equal and bilateral Secured at: 21 cm Tube secured with: Tape Dental Injury: Teeth and Oropharynx as per pre-operative assessment

## 2022-07-30 NOTE — Anesthesia Preprocedure Evaluation (Addendum)
Anesthesia Evaluation  Patient identified by MRN, date of birth, ID band Patient awake    Reviewed: Allergy & Precautions, NPO status , Patient's Chart, lab work & pertinent test results  History of Anesthesia Complications Negative for: history of anesthetic complications  Airway Mallampati: III  TM Distance: >3 FB Neck ROM: Full    Dental  (+) Dental Advisory Given, Teeth Intact   Pulmonary sleep apnea and Continuous Positive Airway Pressure Ventilation    Pulmonary exam normal        Cardiovascular negative cardio ROS Normal cardiovascular exam     Neuro/Psych  PSYCHIATRIC DISORDERS Anxiety Depression    negative neurological ROS     GI/Hepatic negative GI ROS,,,(+)     substance abuse  alcohol use  Endo/Other  negative endocrine ROS    Renal/GU negative Renal ROS     Musculoskeletal negative musculoskeletal ROS (+)    Abdominal   Peds  Hematology negative hematology ROS (+)   Anesthesia Other Findings   Reproductive/Obstetrics                             Anesthesia Physical Anesthesia Plan  ASA: 2  Anesthesia Plan: General   Post-op Pain Management: Tylenol PO (pre-op)*   Induction: Intravenous  PONV Risk Score and Plan: 2 and Treatment may vary due to age or medical condition, Ondansetron, Dexamethasone and Midazolam  Airway Management Planned: Oral ETT  Additional Equipment: None  Intra-op Plan:   Post-operative Plan: Extubation in OR  Informed Consent: I have reviewed the patients History and Physical, chart, labs and discussed the procedure including the risks, benefits and alternatives for the proposed anesthesia with the patient or authorized representative who has indicated his/her understanding and acceptance.     Dental advisory given  Plan Discussed with: CRNA and Anesthesiologist  Anesthesia Plan Comments:        Anesthesia Quick Evaluation

## 2022-07-31 ENCOUNTER — Encounter (HOSPITAL_COMMUNITY): Payer: Self-pay | Admitting: Otolaryngology

## 2022-08-06 DIAGNOSIS — G4733 Obstructive sleep apnea (adult) (pediatric): Secondary | ICD-10-CM | POA: Diagnosis not present

## 2022-08-07 DIAGNOSIS — G4733 Obstructive sleep apnea (adult) (pediatric): Secondary | ICD-10-CM | POA: Diagnosis not present

## 2022-08-09 ENCOUNTER — Other Ambulatory Visit: Payer: Self-pay | Admitting: Family Medicine

## 2022-08-11 ENCOUNTER — Other Ambulatory Visit (HOSPITAL_COMMUNITY): Payer: Self-pay

## 2022-08-11 ENCOUNTER — Other Ambulatory Visit: Payer: Self-pay

## 2022-08-11 MED ORDER — NALTREXONE HCL 50 MG PO TABS
50.0000 mg | ORAL_TABLET | Freq: Every day | ORAL | 2 refills | Status: DC
  Filled 2022-08-11: qty 30, 30d supply, fill #0
  Filled 2022-09-10: qty 30, 30d supply, fill #1
  Filled 2022-10-05: qty 30, 30d supply, fill #2

## 2022-08-11 NOTE — Telephone Encounter (Signed)
Last filled on 04/17/22 #30 tabs/ 2 refills, please advise

## 2022-09-05 DIAGNOSIS — G4733 Obstructive sleep apnea (adult) (pediatric): Secondary | ICD-10-CM | POA: Diagnosis not present

## 2022-09-10 ENCOUNTER — Other Ambulatory Visit (HOSPITAL_COMMUNITY): Payer: Self-pay

## 2022-09-10 NOTE — Progress Notes (Signed)
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  He should be a good candidate.  Still using occasional melatonin.  Still trying on and off CPAP but just does not tolerate it well. CXR 04/23/22- IMPRESSION: No active cardiopulmonary disease.  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 Wife is here. Activation done. He will advance by 1 level/ week as tolerated. CXR 07/30/22 1V IMPRESSION: New generator overlying the right chest with catheter extending into the right neck. No pneumothorax.  ROS-see HPI  + = positive Constitutional:   No-   weight loss, night sweats, fevers, chills, fatigue, lassitude. HEENT:   No-  headaches, difficulty swallowing, tooth/dental problems, sore throat,       No-  sneezing, itching, ear ache, + nasal congestion, post nasal drip,  CV:  No-   chest pain, orthopnea, PND, swelling in lower extremities,  anasarca,                                                    dizziness, palpitations Resp: No-   shortness of breath with exertion or at rest.              No-   productive cough,  No non-productive cough,  No- coughing up of blood.              No-   change in color of mucus.  No- wheezing.   Skin: No-   rash or lesions. GI:  No-   heartburn, indigestion, abdominal pain, nausea, vomiting,  GU:  MS:  No-   joint pain or swelling.   Neuro-     nothing unusual Psych:  No- change in mood or affect. + depression or anxiety.  No memory loss.  OBJ- Physical Exam   General- Alert, Oriented, Affect-appropriate, Distress- none acute, medium build Skin- rash-none, lesions- none, excoriation- none Lymphadenopathy- none Head- atraumatic            Eyes- Gross vision intact, PERRLA, conjunctivae and secretions clear            Ears- Hearing, canals-normal            Nose- Clear, no-Septal dev, mucus, polyps, erosion, perforation             Throat- +Small mandible, Mallampati III , mucosa clear , drainage- none, tonsils- atrophic Neck- flexible , trachea midline, no stridor , thyroid nl, carotid no bruit Chest - symmetrical excursion , unlabored  Heart/CV- RRR , no murmur , no gallop  , no rub, nl s1 s2                           - JVD- none , edema- none, stasis changes- none, varices- none           Lung- clear to P&A, wheeze- none, cough- none , dullness-none, rub- none           Chest wall-  Abd-  Br/ Gen/ Rectal- Not done, not indicated Extrem- cyanosis- none, clubbing, none, atrophy- none, strength- nl Neuro- grossly intact to observation

## 2022-09-12 ENCOUNTER — Ambulatory Visit (INDEPENDENT_AMBULATORY_CARE_PROVIDER_SITE_OTHER): Payer: 59 | Admitting: Internal Medicine

## 2022-09-12 ENCOUNTER — Encounter: Payer: Self-pay | Admitting: Internal Medicine

## 2022-09-12 VITALS — BP 118/78 | HR 71 | Ht 71.0 in | Wt 206.0 lb

## 2022-09-12 DIAGNOSIS — G4733 Obstructive sleep apnea (adult) (pediatric): Secondary | ICD-10-CM

## 2022-09-12 DIAGNOSIS — R4589 Other symptoms and signs involving emotional state: Secondary | ICD-10-CM | POA: Diagnosis not present

## 2022-09-12 NOTE — Assessment & Plan Note (Signed)
Cheerful/ appropriate affect at this visit

## 2022-09-12 NOTE — Assessment & Plan Note (Signed)
Instructed in activation process.  Plan- advance voltage 1 level/ week as tolerated until return in 1 month.

## 2022-09-12 NOTE — Patient Instructions (Signed)
Advance Inspire levels as directed.  Please call Jeanice Lim if there are problems.

## 2022-09-24 DIAGNOSIS — F331 Major depressive disorder, recurrent, moderate: Secondary | ICD-10-CM | POA: Diagnosis not present

## 2022-10-05 ENCOUNTER — Other Ambulatory Visit (HOSPITAL_COMMUNITY): Payer: Self-pay

## 2022-10-06 DIAGNOSIS — G4733 Obstructive sleep apnea (adult) (pediatric): Secondary | ICD-10-CM | POA: Diagnosis not present

## 2022-10-08 DIAGNOSIS — F331 Major depressive disorder, recurrent, moderate: Secondary | ICD-10-CM | POA: Diagnosis not present

## 2022-10-22 ENCOUNTER — Other Ambulatory Visit (HOSPITAL_COMMUNITY): Payer: Self-pay

## 2022-10-29 ENCOUNTER — Ambulatory Visit: Payer: 59 | Admitting: Podiatry

## 2022-10-29 ENCOUNTER — Ambulatory Visit (INDEPENDENT_AMBULATORY_CARE_PROVIDER_SITE_OTHER): Payer: 59

## 2022-10-29 ENCOUNTER — Encounter: Payer: Self-pay | Admitting: Podiatry

## 2022-10-29 DIAGNOSIS — F331 Major depressive disorder, recurrent, moderate: Secondary | ICD-10-CM | POA: Diagnosis not present

## 2022-10-29 DIAGNOSIS — M205X2 Other deformities of toe(s) (acquired), left foot: Secondary | ICD-10-CM | POA: Diagnosis not present

## 2022-10-29 DIAGNOSIS — M7751 Other enthesopathy of right foot: Secondary | ICD-10-CM | POA: Diagnosis not present

## 2022-10-29 DIAGNOSIS — M7741 Metatarsalgia, right foot: Secondary | ICD-10-CM

## 2022-10-29 DIAGNOSIS — M7752 Other enthesopathy of left foot: Secondary | ICD-10-CM

## 2022-10-29 NOTE — Progress Notes (Deleted)
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 No pneumothorax.  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 He will advance by 1 level/ week as tolerated. Body weight today-   ROS-see HPI  + = positive Constitutional:   No-   weight loss, night sweats, fevers, chills, fatigue, lassitude. HEENT:   No-  headaches, difficulty swallowing, tooth/dental problems, sore throat,       No-  sneezing, itching, ear ache, + nasal congestion, post nasal drip,  CV:  No-   chest pain, orthopnea, PND, swelling in lower extremities, anasarca,                                                    dizziness, palpitations Resp: No-   shortness of breath with exertion or at rest.              No-   productive cough,  No non-productive cough,  No- coughing up of blood.              No-   change in color of mucus.  No- wheezing.   Skin: No-   rash or lesions. GI:  No-   heartburn, indigestion, abdominal pain, nausea, vomiting,  GU:  MS:  No-   joint pain or swelling.   Neuro-     nothing unusual Psych:  No- change in mood or affect. + depression or anxiety.  No memory loss.  OBJ- Physical Exam   General- Alert, Oriented, Affect-appropriate, Distress- none acute, medium build Skin- rash-none, lesions- none, excoriation- none Lymphadenopathy- none Head-  atraumatic            Eyes- Gross vision intact, PERRLA, conjunctivae and secretions clear            Ears- Hearing, canals-normal            Nose- Clear, no-Septal dev, mucus, polyps, erosion, perforation             Throat- +Small mandible, Mallampati III , mucosa clear , drainage- none, tonsils- atrophic Neck- flexible , trachea midline, no stridor , thyroid nl, carotid no bruit Chest - symmetrical excursion , unlabored           Heart/CV- RRR , no murmur , no gallop  , no rub, nl s1 s2                           - JVD- none , edema- none, stasis changes- none, varices- none           Lung- clear to P&A, wheeze- none, cough- none , dullness-none, rub- none           Chest wall-  Abd-  Br/ Gen/ Rectal- Not done, not indicated Extrem- cyanosis- none, clubbing, none, atrophy- none, strength- nl Neuro- grossly intact to observation

## 2022-10-29 NOTE — Progress Notes (Signed)
   Chief Complaint  Patient presents with   Foot Pain    Patient is here for bilateral foot pain in ball of foot and metatarsals     HPI: 52 y.o. male presenting today as a new patient for evaluation of bilateral foot pain.  Gradual idiopathic onset.  Denies a history of injury.  Has not anything for treatment.  Past Medical History:  Diagnosis Date   Depression    on meds   Fever blister    on meds   Hx of sessile serrated colonic polyps 04/10/2020   Sleep apnea    uses CPAP   Substance abuse (HCC)    Hx ETOH    Past Surgical History:  Procedure Laterality Date   ANTERIOR CRUCIATE LIGAMENT REPAIR Right 1992   COLONOSCOPY  2022   DRUG INDUCED ENDOSCOPY N/A 01/08/2022   Procedure: DRUG INDUCED ENDOSCOPY;  Surgeon: Christia Reading, MD;  Location: [facility];  Service: ENT;  Laterality: N/A;   HERNIA REPAIR Bilateral    x 2 as a child   IMPLANTATION OF HYPOGLOSSAL NERVE STIMULATOR N/A 07/30/2022   Procedure: IMPLANTATION OF HYPOGLOSSAL NERVE STIMULATOR;  Surgeon: Christia Reading, MD;  Location: [facility] OR;  Service: ENT;  Laterality: N/A;    No Known Allergies   Physical Exam: General: The patient is alert and oriented x3 in no acute distress.  Dermatology: Skin is warm, dry and supple bilateral lower extremities.   Vascular: Palpable pedal pulses bilaterally. Capillary refill within normal limits.  No appreciable edema.  No erythema.  Neurological: Grossly intact via light touch  Musculoskeletal Exam: There is some tenderness throughout palpation of the right forefoot consistent with findings of metatarsalgia.  Generalized pain along the ball of the foot.  There is also some tenderness with palpation and range of motion to the left great toe joint consistent with hallux limitus  Radiographic Exam B/L feet 10/29/2022:  Normal osseous mineralization.  No acute fractures identified.  Degenerative changes noted to the left great toe joint.  Assessment/Plan of Care: 1.  Metatarsalgia RT  2.  Hallux limitus LT  -Patient evaluated.  X-rays reviewed -Today we discussed conservative treatment modalities including custom molded orthotics to help support the medial longitudinal arch of the foot and offload pressure from the forefoot.  I do believe this would be beneficial for the patient. -Appointment with orthotics department for custom orthotics.  Order placed -Recommend OTC anti-inflammatory as needed -Return to clinic as needed     Felecia Shelling, DPM [facility]  Dr. Felecia Shelling, DPM    [address] [place], [place] [address]                Office [phone]  Fax [phone]

## 2022-10-31 ENCOUNTER — Ambulatory Visit: Payer: 59 | Admitting: Internal Medicine

## 2022-11-06 DIAGNOSIS — G4733 Obstructive sleep apnea (adult) (pediatric): Secondary | ICD-10-CM | POA: Diagnosis not present

## 2022-11-08 NOTE — Progress Notes (Unsigned)
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 Wife is here. Activation done. He will advance by 1 level/ week as tolerated. CXR 07/30/22 1V IMPRESSION: New generator overlying the right chest with catheter extending into the right neck. No pneumothorax.  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 Inspire implantation (Dr Jenne Pane) 07/30/22- For Inspire f/u since activation last visit. Now level-8. Ginny here from [place], adjusting. He wore both CPAP and Inspire up to level 6.He is comfortable with no concerns expressed. He is ok to stay at level 8, or advance if he waants to try. Jeanice Lim will schedule HST on Inspire- goal AHI is 15 or less.  ROS-see HPI  + = positive Constitutional:   No-   weight loss, night sweats, fevers, chills, fatigue, lassitude. HEENT:   No-  headaches, difficulty swallowing, tooth/dental problems, sore throat,       No-  sneezing, itching, ear ache, + nasal congestion, post nasal drip,  CV:  No-   chest pain, orthopnea, PND, swelling in lower extremities, anasarca,                                                    dizziness,  palpitations Resp: No-   shortness of breath with exertion or at rest.              No-   productive cough,  No non-productive cough,  No- coughing up of blood.              No-   change in color of mucus.  No- wheezing.   Skin: No-   rash or lesions. GI:  No-   heartburn, indigestion, abdominal pain, nausea, vomiting,  GU:  MS:  No-   joint pain or swelling.   Neuro-     nothing unusual Psych:  No- change in mood or affect. + depression or anxiety.  No memory loss.  OBJ- Physical Exam   General- Alert, Oriented, Affect-appropriate, Distress- none acute, medium build Skin- rash-none, lesions- none, excoriation- none Lymphadenopathy- none Head- atraumatic            Eyes- Gross vision intact, PERRLA, conjunctivae and secretions clear            Ears- Hearing, canals-normal            Nose- Clear, no-Septal dev, mucus, polyps, erosion, perforation             Throat- +Small mandible, Mallampati III , mucosa clear , drainage- none, tonsils- atrophic Neck- flexible , trachea midline, no stridor , thyroid nl, carotid no bruit Chest - symmetrical excursion , unlabored           Heart/CV- RRR , no murmur , no  gallop  , no rub, nl s1 s2                           - JVD- none , edema- none, stasis changes- none, varices- none           Lung- clear to P&A, wheeze- none, cough- none , dullness-none, rub- none           Chest wall-  Abd-  Br/ Gen/ Rectal- Not done, not indicated Extrem- cyanosis- none, clubbing, none, atrophy- none, strength- nl Neuro- grossly intact to observation

## 2022-11-10 ENCOUNTER — Ambulatory Visit: Payer: 59 | Admitting: Internal Medicine

## 2022-11-10 ENCOUNTER — Encounter: Payer: Self-pay | Admitting: Internal Medicine

## 2022-11-10 ENCOUNTER — Other Ambulatory Visit: Payer: Self-pay | Admitting: Internal Medicine

## 2022-11-10 VITALS — BP 120/78 | HR 70 | Temp 97.9°F | Ht 71.0 in | Wt 204.6 lb

## 2022-11-10 DIAGNOSIS — G4733 Obstructive sleep apnea (adult) (pediatric): Secondary | ICD-10-CM | POA: Diagnosis not present

## 2022-11-10 DIAGNOSIS — M5440 Lumbago with sciatica, unspecified side: Secondary | ICD-10-CM

## 2022-11-10 NOTE — Patient Instructions (Addendum)
Ok to continue level 8 or advance as you wish.  Order- Eric Blackburn will contact you to schedule a sleep study on Inspire.     We will see you again a few weeks after that study.

## 2022-11-12 ENCOUNTER — Other Ambulatory Visit: Payer: Self-pay | Admitting: Family Medicine

## 2022-11-12 ENCOUNTER — Other Ambulatory Visit (HOSPITAL_COMMUNITY): Payer: Self-pay

## 2022-11-12 MED ORDER — NALTREXONE HCL 50 MG PO TABS
50.0000 mg | ORAL_TABLET | Freq: Every day | ORAL | 2 refills | Status: DC
  Filled 2022-11-12: qty 30, 30d supply, fill #0

## 2022-11-12 NOTE — Telephone Encounter (Signed)
Last OV with PCP was 01/28/22, last filled on 08/11/22 #30 tabs/ with 2 refills

## 2022-11-13 ENCOUNTER — Other Ambulatory Visit: Payer: Self-pay

## 2022-11-19 ENCOUNTER — Encounter: Payer: Self-pay | Admitting: Internal Medicine

## 2022-11-19 NOTE — Assessment & Plan Note (Signed)
Advancing Inspire as planned. Plan-okay to stay at level 8 or advance if he chooses based on comfort.  Schedule HST on Inspire.  Goal AHI 15 or less.

## 2022-11-19 NOTE — Assessment & Plan Note (Signed)
Greater sleep position flexibility with Eric Blackburn may be helpful.

## 2022-11-25 ENCOUNTER — Encounter: Payer: Self-pay | Admitting: Internal Medicine

## 2022-11-26 DIAGNOSIS — F331 Major depressive disorder, recurrent, moderate: Secondary | ICD-10-CM | POA: Diagnosis not present

## 2022-12-03 ENCOUNTER — Ambulatory Visit: Payer: 59

## 2022-12-10 ENCOUNTER — Encounter: Payer: Self-pay | Admitting: Internal Medicine

## 2022-12-17 DIAGNOSIS — F331 Major depressive disorder, recurrent, moderate: Secondary | ICD-10-CM | POA: Diagnosis not present

## 2022-12-19 ENCOUNTER — Other Ambulatory Visit (HOSPITAL_COMMUNITY): Payer: Self-pay

## 2022-12-19 MED ORDER — SHINGRIX 50 MCG/0.5ML IM SUSR
0.5000 mL | Freq: Once | INTRAMUSCULAR | 0 refills | Status: AC
  Filled 2022-12-19: qty 0.5, 1d supply, fill #0

## 2022-12-30 ENCOUNTER — Other Ambulatory Visit: Payer: Self-pay | Admitting: Family Medicine

## 2022-12-31 ENCOUNTER — Other Ambulatory Visit: Payer: Self-pay

## 2022-12-31 MED ORDER — ESCITALOPRAM OXALATE 20 MG PO TABS
20.0000 mg | ORAL_TABLET | Freq: Every day | ORAL | 0 refills | Status: DC
  Filled 2022-12-31: qty 90, 90d supply, fill #0

## 2022-12-31 NOTE — Telephone Encounter (Signed)
Med refilled once. Pt is due for a CPE labs prior or at least a med refill f/u appt next month, please schedule. Thanks

## 2023-01-02 NOTE — Progress Notes (Signed)
Appt set delivery orthotics on 11/19 8:45 am

## 2023-01-05 NOTE — Telephone Encounter (Signed)
Procedure scheduled  02/03/2023 Status: Sch  Time: 8:00 PM

## 2023-01-05 NOTE — Progress Notes (Signed)
Patient was seen, measured / scanned for custom molded foot orthotics.  Patient will benefit from CFO's as they will help provide total contact to MLA's helping to better distribute body weight across BIL feet greater reducing plantar pressure and pain and to also encourage FF and RF alignment.  Patient was scanned items to be ordered and fit when in

## 2023-01-06 NOTE — Progress Notes (Unsigned)
Subjective:    Patient ID: Eric Blackburn, male    DOB: 1970/09/24, 52 y.o.   MRN: [id number]  HPI  Here for health maintenance exam and to review chronic medical problems   Wt Readings from Last 3 Encounters:  01/07/23 206 lb 9.6 oz (93.7 kg)  11/10/22 204 lb 9.6 oz (92.8 kg)  09/12/22 206 lb (93.4 kg)   28.81 kg/m  Vitals:   01/07/23 1420  BP: 118/72  Pulse: 73  Temp: 97.7 F (36.5 C)  SpO2: 98%    Immunization History  Administered Date(s) Administered   Influenza Split 11/24/2013, 10/26/2014, 11/25/2015   Influenza,inj,Quad PF,6+ Mos 11/13/2016, 12/04/2020, 11/18/2021   Influenza,inj,quad, With Preservative 11/25/2018   Influenza-Unspecified 11/24/2012, 11/08/2017, 12/13/2018, 11/09/2019, 11/09/2022   PFIZER Comirnaty(Gray Top)Covid-19 Tri-Sucrose Vaccine 12/05/2021   PFIZER(Purple Top)SARS-COV-2 Vaccination 11/05/2018, 11/26/2018, 12/09/2019   Tdap 05/25/2013   Zoster Recombinant(Shingrix) 12/19/2022    Health Maintenance Due  Topic Date Due   HIV Screening  Never done   Hepatitis C Screening  Never done   Feeling pretty good   Flu shot -had it /employee   Had first shingrix vaccine    May consider prevnar 20 later   Will get covid shot   Prostate health Lab Results  Component Value Date   PSA 2.80 03/14/2022   PSA 3.11 09/18/2021   PSA 2.21 01/10/2020  No voiding changes Nocturia times 3 for years- he hydrates a lot in the evening   Colon cancer screening  colonoscopy 03/2020 with 5 y recall   Bone health   Falls-none Fractures-none  Supplements  mvi daily and 1000 international units vit D  Exercise  Goes to the gym almost every day   Etoh- 1-2 drinks per week   Went to ortho today Dr Rennis Chris for shoulder pain /elbow pain Prednisone for that -to start  Getting custom orthotics   Got inspire implant for osa -making adjustments Sleep study planned for dec  Sometimes uses with cpap   Goes to derm once per year    Mood     01/07/2023    2:30 PM 01/28/2022    2:06 PM 09/17/2021   11:26 AM 09/12/2020    3:06 PM 10/20/2019   11:43 AM  Depression screen PHQ 2/9  Decreased Interest 0 1 2 1 1   Down, Depressed, Hopeless 1 2 2 2 2   PHQ - 2 Score 1 3 4 3 3   Altered sleeping 1 0 1 2 0  Tired, decreased energy 0 1 2 2 1   Change in appetite 0 2 2 3 1   Feeling bad or failure about yourself  0 0 0 1 1  Trouble concentrating 0 0 1 1 3   Moving slowly or fidgety/restless 0 0 0 0 0  Suicidal thoughts 0 0 0 0 0  PHQ-9 Score 2 6 10 12 9   Difficult doing work/chores Not difficult at all  Somewhat difficult Somewhat difficult    Wellbutrin xl 300 mg daily  Lexapro 20 mg daily  Goes to therapy Hal Neer - every 3 weeks   Tries to get away from tech  Ashby Dawes walks    Stress  Wife in treatment for liver failure /rejection   Cholesterol Lab Results  Component Value Date   CHOL 213 (H) 09/18/2021   HDL 65.90 09/18/2021   LDLCALC 133 (H) 09/18/2021   TRIG 68.0 09/18/2021   CHOLHDL 3 09/18/2021      Patient Active Problem List   Diagnosis Date Noted  History of alcohol use 01/28/2022   Hx of sessile serrated colonic polyps 04/10/2020   Hyperkalemia 12/24/2016   Prostate cancer screening 09/10/2015   Obstructive sleep apnea 05/25/2013   Routine general medical examination at a health care facility 05/16/2013   Depression with anxiety 03/31/2007   HYPOSPADIAS 03/31/2007   Past Medical History:  Diagnosis Date   Depression    on meds   Fever blister    on meds   Hx of sessile serrated colonic polyps 04/10/2020   Sleep apnea    uses CPAP   Substance abuse (HCC)    Hx ETOH   Past Surgical History:  Procedure Laterality Date   ANTERIOR CRUCIATE LIGAMENT REPAIR Right 1992   COLONOSCOPY  2022   DRUG INDUCED ENDOSCOPY N/A 01/08/2022   Procedure: DRUG INDUCED ENDOSCOPY;  Surgeon: Christia Reading, MD;  Location: [facility];  Service: ENT;  Laterality: N/A;   HERNIA REPAIR Bilateral    x  2 as a child   IMPLANTATION OF HYPOGLOSSAL NERVE STIMULATOR N/A 07/30/2022   Procedure: IMPLANTATION OF HYPOGLOSSAL NERVE STIMULATOR;  Surgeon: Christia Reading, MD;  Location: [facility] OR;  Service: ENT;  Laterality: N/A;   Social History   Tobacco Use   Smoking status: Never    Passive exposure: Past   Smokeless tobacco: Never  Vaping Use   Vaping status: Never Used  Substance Use Topics   Alcohol use: Not Currently    Alcohol/week: 1.0 standard drink of alcohol    Types: 1 Standard drinks or equivalent per week    Comment: one per week   Drug use: No   Family History  Problem Relation Age of Onset   Depression Mother    Diabetes type II Mother    Pulmonary Hypertension Mother    Stroke Sister 28       hemorrhagic post procedure   Anxiety disorder Sister    Depression Sister    Asthma Sister        developed as a child   Lung cancer Sister 92       small cell   Emphysema Maternal Grandfather    Prostate cancer Maternal Grandfather    Lymphoma Paternal Grandmother    Colon polyps Neg Hx    Colon cancer Neg Hx    Esophageal cancer Neg Hx    Stomach cancer Neg Hx    Rectal cancer Neg Hx    No Known Allergies Current Outpatient Medications on File Prior to Visit  Medication Sig Dispense Refill   buPROPion (WELLBUTRIN XL) 300 MG 24 hr tablet Take 1 tablet (300 mg total) by mouth daily. 90 tablet 2   cholecalciferol (VITAMIN D3) 25 MCG (1000 UNIT) tablet Take 1,000 Units by mouth daily.     magnesium oxide (MAG-OX) 400 (240 Mg) MG tablet Take 400 mg by mouth daily. At bedtime     melatonin 5 MG TABS Take 1 tablet (5 mg total) by mouth at bedtime as needed. 30 tablet 12   Multiple Vitamin (MULTIVITAMIN) capsule Take 1 capsule by mouth daily.     naproxen sodium (ALEVE) 220 MG tablet Take 220 mg by mouth 2 (two) times daily as needed.     Omega-3 Fatty Acids (FISH OIL) 1000 MG CAPS Take 1 capsule by mouth 2 (two) times daily.     valACYclovir (VALTREX) 1000 MG tablet TAKE 2 TABLETS  BY MOUTH TWO TIMES A DAY FOR 1 DAY FOR COLD SORE AS NEEDED. 4 tablet 5   No  current facility-administered medications on file prior to visit.    Review of Systems  Constitutional:  Negative for activity change, appetite change, fatigue, fever and unexpected weight change.  HENT:  Negative for congestion, rhinorrhea, sore throat and trouble swallowing.   Eyes:  Negative for pain, redness, itching and visual disturbance.  Respiratory:  Negative for cough, chest tightness, shortness of breath and wheezing.   Cardiovascular:  Negative for chest pain and palpitations.  Gastrointestinal:  Negative for abdominal pain, blood in stool, constipation, diarrhea and nausea.  Endocrine: Negative for cold intolerance, heat intolerance, polydipsia and polyuria.  Genitourinary:  Negative for difficulty urinating, dysuria, frequency and urgency.  Musculoskeletal:  Positive for arthralgias. Negative for joint swelling and myalgias.  Skin:  Negative for pallor and rash.  Neurological:  Negative for dizziness, tremors, weakness, numbness and headaches.  Hematological:  Negative for adenopathy. Does not bruise/bleed easily.  Psychiatric/Behavioral:  Negative for decreased concentration and dysphoric mood. The patient is nervous/anxious.        Stress level is high  Mood improved overall        Objective:   Physical Exam Constitutional:      General: He is not in acute distress.    Appearance: Normal appearance. He is well-developed and normal weight. He is not ill-appearing or diaphoretic.  HENT:     Head: Normocephalic and atraumatic.     Right Ear: Tympanic membrane, ear canal and external ear normal.     Left Ear: Tympanic membrane, ear canal and external ear normal.     Nose: Nose normal. No congestion.     Mouth/Throat:     Mouth: Mucous membranes are moist.     Pharynx: Oropharynx is clear. No posterior oropharyngeal erythema.  Eyes:     General: No scleral icterus.       Right eye: No  discharge.        Left eye: No discharge.     Conjunctiva/sclera: Conjunctivae normal.     Pupils: Pupils are equal, round, and reactive to light.  Neck:     Thyroid: No thyromegaly.     Vascular: No carotid bruit or JVD.  Cardiovascular:     Rate and Rhythm: Normal rate and regular rhythm.     Pulses: Normal pulses.     Heart sounds: Normal heart sounds.     No gallop.  Pulmonary:     Effort: Pulmonary effort is normal. No respiratory distress.     Breath sounds: Normal breath sounds. No wheezing or rales.     Comments: Good air exch Chest:     Chest wall: No tenderness.  Abdominal:     General: Bowel sounds are normal. There is no distension or abdominal bruit.     Palpations: Abdomen is soft. There is no mass.     Tenderness: There is no abdominal tenderness.     Hernia: No hernia is present.  Musculoskeletal:        General: No tenderness.     Cervical back: Normal range of motion and neck supple. No rigidity. No muscular tenderness.     Right lower leg: No edema.     Left lower leg: No edema.  Lymphadenopathy:     Cervical: No cervical adenopathy.  Skin:    General: Skin is warm and dry.     Coloration: Skin is not pale.     Findings: No erythema or rash.     Comments: Solar lentigines diffusely   Neurological:  Mental Status: He is alert.     Cranial Nerves: No cranial nerve deficit.     Motor: No abnormal muscle tone.     Coordination: Coordination normal.     Gait: Gait normal.     Deep Tendon Reflexes: Reflexes are normal and symmetric. Reflexes normal.  Psychiatric:        Mood and Affect: Mood normal.        Cognition and Memory: Cognition and memory normal.           Assessment & Plan:   Problem List Items Addressed This Visit       Respiratory   Obstructive sleep apnea    Has aspire device Also using cpap off/on while getting it figured out         Other   Depression with anxiety    Improved Reviewed stressors/ coping  techniques/symptoms/ support sources/ tx options and side effects in detail today Wellbutrin xl 300 mg daily  Lexapro 20 mg daily  Goes to therapy [facility] - every 3 weeks   Good health habits Better coping techniques       Relevant Medications   escitalopram (LEXAPRO) 20 MG tablet   History of alcohol use    Doing better Off naltrexone  1-2 beers per week   Will reach out if he feels he needs help  Wife is recovered alcoholic with liver transplant       Hx of sessile serrated colonic polyps    Colonoscopy 2022 with 5 y recall      Prostate cancer screening    Lab Results  Component Value Date   PSA 2.80 03/14/2022   PSA 3.11 09/18/2021   PSA 2.21 01/10/2020    Due for psa after jan 19 No change in voiding  Nocturia from big fluid intake in evening       Routine general medical examination at a health care facility - Primary    Reviewed health habits including diet and exercise and skin cancer prevention Reviewed appropriate screening tests for age  Also reviewed health mt list, fam hx and immunization status , as well as social and family history   See HPI Labs reviewed and ordered Sent for flu shot date  Planning 2nd shingrix vaccine in dec or later  Planning covid booster  Psa utd 02/2022  No change in voiding  Colonoscopy 03/2020 with 5 y recall Discussed fall prevention, supplements and exercise for bone density  Etoh very limited Sees derm yearly for skin screening  PHQ 2   improved with current treatment  Labs ordered for wellness/fasting/future       Relevant Orders   TSH   Lipid panel   Comprehensive metabolic panel   CBC with Differential/Platelet

## 2023-01-07 ENCOUNTER — Other Ambulatory Visit (HOSPITAL_COMMUNITY): Payer: Self-pay

## 2023-01-07 ENCOUNTER — Ambulatory Visit (INDEPENDENT_AMBULATORY_CARE_PROVIDER_SITE_OTHER): Payer: 59 | Admitting: Family Medicine

## 2023-01-07 ENCOUNTER — Encounter: Payer: Self-pay | Admitting: Family Medicine

## 2023-01-07 VITALS — BP 118/72 | HR 73 | Temp 97.7°F | Ht 71.0 in | Wt 206.6 lb

## 2023-01-07 DIAGNOSIS — Z125 Encounter for screening for malignant neoplasm of prostate: Secondary | ICD-10-CM | POA: Diagnosis not present

## 2023-01-07 DIAGNOSIS — G4733 Obstructive sleep apnea (adult) (pediatric): Secondary | ICD-10-CM

## 2023-01-07 DIAGNOSIS — F418 Other specified anxiety disorders: Secondary | ICD-10-CM

## 2023-01-07 DIAGNOSIS — Z0000 Encounter for general adult medical examination without abnormal findings: Secondary | ICD-10-CM | POA: Diagnosis not present

## 2023-01-07 DIAGNOSIS — Z87898 Personal history of other specified conditions: Secondary | ICD-10-CM | POA: Diagnosis not present

## 2023-01-07 DIAGNOSIS — Z860100 Personal history of colon polyps, unspecified: Secondary | ICD-10-CM

## 2023-01-07 DIAGNOSIS — M7542 Impingement syndrome of left shoulder: Secondary | ICD-10-CM | POA: Diagnosis not present

## 2023-01-07 DIAGNOSIS — F331 Major depressive disorder, recurrent, moderate: Secondary | ICD-10-CM | POA: Diagnosis not present

## 2023-01-07 DIAGNOSIS — M25512 Pain in left shoulder: Secondary | ICD-10-CM | POA: Diagnosis not present

## 2023-01-07 MED ORDER — PREDNISONE 10 MG (48) PO TBPK
ORAL_TABLET | ORAL | 0 refills | Status: DC
  Filled 2023-01-07: qty 48, 12d supply, fill #0

## 2023-01-07 MED ORDER — ESCITALOPRAM OXALATE 20 MG PO TABS
20.0000 mg | ORAL_TABLET | Freq: Every day | ORAL | 3 refills | Status: DC
  Filled 2023-01-07 – 2023-03-31 (×2): qty 90, 90d supply, fill #0
  Filled 2023-06-25: qty 90, 90d supply, fill #1
  Filled 2023-09-23: qty 90, 90d supply, fill #2
  Filled 2023-12-22: qty 90, 90d supply, fill #3

## 2023-01-07 NOTE — Assessment & Plan Note (Addendum)
Reviewed health habits including diet and exercise and skin cancer prevention Reviewed appropriate screening tests for age  Also reviewed health mt list, fam hx and immunization status , as well as social and family history   See HPI Labs reviewed and ordered Sent for flu shot date  Planning 2nd shingrix vaccine in dec or later  Planning covid booster  Psa utd 02/2022  No change in voiding  Colonoscopy 03/2020 with 5 y recall Discussed fall prevention, supplements and exercise for bone density  Etoh very limited Sees derm yearly for skin screening  PHQ 2   improved with current treatment  Labs ordered for wellness/fasting/future

## 2023-01-07 NOTE — Assessment & Plan Note (Signed)
Lab Results  Component Value Date   PSA 2.80 03/14/2022   PSA 3.11 09/18/2021   PSA 2.21 01/10/2020    Due for psa after jan 19 No change in voiding  Nocturia from big fluid intake in evening

## 2023-01-07 NOTE — Assessment & Plan Note (Signed)
Doing better Off naltrexone  1-2 beers per week   Will reach out if he feels he needs help  Wife is recovered alcoholic with liver transplant

## 2023-01-07 NOTE — Assessment & Plan Note (Signed)
Has aspire device Also using cpap off/on while getting it figured out

## 2023-01-07 NOTE — Assessment & Plan Note (Signed)
Improved Reviewed stressors/ coping techniques/symptoms/ support sources/ tx options and side effects in detail today Wellbutrin xl 300 mg daily  Lexapro 20 mg daily  Goes to therapy [facility]er - every 3 weeks   Good health habits Better coping techniques

## 2023-01-07 NOTE — Patient Instructions (Addendum)
Take care of yourself  Keep up the good work    Stop at check out and schedule fasting labs when able

## 2023-01-07 NOTE — Assessment & Plan Note (Signed)
Colonoscopy 2022 with 5 y recall

## 2023-01-13 ENCOUNTER — Ambulatory Visit (INDEPENDENT_AMBULATORY_CARE_PROVIDER_SITE_OTHER): Payer: 59

## 2023-01-13 DIAGNOSIS — M205X2 Other deformities of toe(s) (acquired), left foot: Secondary | ICD-10-CM

## 2023-01-13 DIAGNOSIS — M7741 Metatarsalgia, right foot: Secondary | ICD-10-CM | POA: Diagnosis not present

## 2023-01-13 NOTE — Progress Notes (Signed)
Patient presents today to pick up custom molded foot orthotics, diagnosed with Metatarsalgia, Hallux limitus by Dr. Logan Bores.   Orthotics were dispensed and fit was satisfactory. Reviewed instructions for break-in and wear. Written instructions given to patient.  Patient will follow up as needed.   Addison Bailey CPed, CFo, CFm

## 2023-01-27 DIAGNOSIS — M25521 Pain in right elbow: Secondary | ICD-10-CM | POA: Diagnosis not present

## 2023-01-27 DIAGNOSIS — M25512 Pain in left shoulder: Secondary | ICD-10-CM | POA: Diagnosis not present

## 2023-01-27 DIAGNOSIS — M7701 Medial epicondylitis, right elbow: Secondary | ICD-10-CM | POA: Diagnosis not present

## 2023-01-29 ENCOUNTER — Other Ambulatory Visit (HOSPITAL_COMMUNITY): Payer: Self-pay

## 2023-01-29 MED ORDER — COVID-19 MRNA VAC-TRIS(PFIZER) 30 MCG/0.3ML IM SUSY
0.3000 mL | PREFILLED_SYRINGE | Freq: Once | INTRAMUSCULAR | 0 refills | Status: AC
  Filled 2023-01-29: qty 0.3, 1d supply, fill #0

## 2023-02-02 ENCOUNTER — Other Ambulatory Visit: Payer: Self-pay

## 2023-02-02 NOTE — Progress Notes (Signed)
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, Risk manager, followed for OSA , rhinitis Unattended home sleep study"[name]" 07/24/13- mild obstructive sleep apnea, AHI 13 per hour, weight 206 pounds NPSG 11/04/21- AHI 55.8/ hr, desaturation to 78% (120 min </= 88%), body weight 215 lbs Inspire implantation (Dr Jenne Pane) 07/30/22- -----------------------------------------------------------------------------------------------------    11/10/22- 52 year old male never smoker, [employer], followed for OSA, nasal congestion, complicated by low back pain, Depression, rhinitis CPAP auto 4-10/Adapt>> Inspire Melatonin- Body weight today-204 lbs. Inspire implantation (Dr Jenne Pane) 07/30/22- For Inspire f/u since activation last visit. Now level-8. Ginny here from [place], adjusting. He wore both CPAP and Inspire up to level 6.He is comfortable with no concerns expressed. He is ok to stay at level 8, or advance if he wants to try. Jeanice Lim will schedule HST on Inspire- goal AHI is 15 or less.  02/04/23- 52 year old male never smoker, [employer], followed for OSA, nasal congestion, complicated by low back pain, Depression, rhinitis CPAP auto 4-10/Adapt>> Inspire Melatonin- Body weight today- Inspire implantation (Dr Jenne Pane) 07/30/22- He was to have HST on Inspire after last visit- not done??- check with [facility]- scheduled for 02/03/23-    Need to reschedule ov.     ROS-see HPI  + = positive Constitutional:   No-   weight loss, night sweats, fevers, chills, fatigue, lassitude. HEENT:   No-  headaches, difficulty swallowing, tooth/dental problems, sore throat,       No-  sneezing, itching, ear ache, + nasal congestion, post nasal drip,  CV:  No-   chest pain, orthopnea, PND, swelling in lower extremities, anasarca,                                                    dizziness, palpitations Resp: No-   shortness of breath with exertion or at rest.              No-   productive cough,  No non-productive  cough,  No- coughing up of blood.              No-   change in color of mucus.  No- wheezing.   Skin: No-   rash or lesions. GI:  No-   heartburn, indigestion, abdominal pain, nausea, vomiting,  GU:  MS:  No-   joint pain or swelling.   Neuro-     nothing unusual Psych:  No- change in mood or affect. + depression or anxiety.  No memory loss.  OBJ- Physical Exam   General- Alert, Oriented, Affect-appropriate, Distress- none acute, medium build Skin- rash-none, lesions- none, excoriation- none Lymphadenopathy- none Head- atraumatic            Eyes- Gross vision intact, PERRLA, conjunctivae and secretions clear            Ears- Hearing, canals-normal            Nose- Clear, no-Septal dev, mucus, polyps, erosion, perforation             Throat- +Small mandible, Mallampati III , mucosa clear , drainage- none, tonsils- atrophic Neck- flexible , trachea midline, no stridor , thyroid nl, carotid no bruit Chest - symmetrical excursion , unlabored           Heart/CV- RRR , no murmur , no gallop  , no rub, nl s1 s2                           -  JVD- none , edema- none, stasis changes- none, varices- none           Lung- clear to P&A, wheeze- none, cough- none , dullness-none, rub- none           Chest wall-  Abd-  Br/ Gen/ Rectal- Not done, not indicated Extrem- cyanosis- none, clubbing, none, atrophy- none, strength- nl Neuro- grossly intact to observation

## 2023-02-03 ENCOUNTER — Ambulatory Visit (HOSPITAL_BASED_OUTPATIENT_CLINIC_OR_DEPARTMENT_OTHER): Payer: 59 | Attending: Internal Medicine | Admitting: Internal Medicine

## 2023-02-03 DIAGNOSIS — I493 Ventricular premature depolarization: Secondary | ICD-10-CM | POA: Insufficient documentation

## 2023-02-03 DIAGNOSIS — G4733 Obstructive sleep apnea (adult) (pediatric): Secondary | ICD-10-CM | POA: Diagnosis not present

## 2023-02-08 NOTE — Procedures (Signed)
Patient Name: Eric Blackburn, Schlie Date: 02/03/2023 Gender: Male D.O.B: 08-Mar-1970 Age (years): 59 Referring Provider: Jetty Duhamel MD, ABSM Height (inches): 71 Interpreting Physician: Jetty Duhamel MD, ABSM Weight (lbs): 205 RPSGT: Shelah Lewandowsky BMI: 29 MRN: [id number] Neck Size: 16.00  CLINICAL INFORMATION The patient is referred for an Inspire titration to treat sleep apnea.  Date of NPSG, Split Night or HST: NPSG 11/04/21  AHI 55.8/hr, desaturation to 78%, body weight 215 lbs  SLEEP STUDY TECHNIQUE As per the AASM Manual for the Scoring of Sleep and Associated Events v2.3 (April 2016) with a hypopnea requiring 4% desaturations.  The channels recorded and monitored were frontal, central and occipital EEG, electrooculogram (EOG), submentalis EMG (chin), nasal and oral airflow, thoracic and abdominal wall motion, anterior tibialis EMG, snore microphone, electrocardiogram, and pulse oximetry. Continuous positive airway pressure (CPAP) was initiated at the beginning of the study and titrated to treat sleep-disordered breathing.  MEDICATIONS Medications self-administered by patient taken the night of the study : BENADRYL, DAILY MULTIVITAMIN, FISH OIL, MagOx, MELATONIN  TECHNICIAN COMMENTS Comments added by technician: NONE Comments added by scorer: N/A  RESPIRATORY PARAMETERS Optimal PAP Pressure (cm): 1.4 AHI at Optimal Pressure (/hr): 2.1 Overall Minimal O2 (%): 82.0 Supine % at Optimal Pressure (%): 11 Minimal O2 at Optimal Pressure (%): 86.0   SLEEP ARCHITECTURE The study was initiated at 10:00:51 PM and ended at 4:58:23 AM.  Sleep onset time was 22.4 minutes and the sleep efficiency was 68.7%. The total sleep time was 286.6 minutes.  The patient spent 13.1% of the night in stage N1 sleep, 81.7% in stage N2 sleep, 0.0% in stage N3 and 5.2% in REM.Stage REM latency was 292.5 minutes  Wake after sleep onset was 108.5. Alpha intrusion was absent. Supine sleep was  49.73%.  CARDIAC DATA The 2 lead EKG demonstrated sinus rhythm. The mean heart rate was 55.1 beats per minute. Other EKG findings include: PVCs.  LEG MOVEMENT DATA The total Periodic Limb Movements of Sleep (PLMS) were 0. The PLMS index was 0.0. A PLMS index of <15 is considered normal in adults.  IMPRESSIONS - Inspire voltage on arrival was 1.0V. Titration was started at [facility] and advanced to final 1.4V. AHI on 1.4V was 2.1/hr. At conclusion, voltage was returned to arrival setting of 1.0V. - Central sleep apnea was not noted during this titration (CAI = 0.2/h). - Moderate oxygen desaturations were observed during this titration (min O2 = 82.0%). On 1.4V, minimum O2 saturation was 86% and mean 91.9%. - The patient snored with moderate snoring volume during this titration study. - 2-lead EKG demonstrated: PVCs - Clinically significant periodic limb movements were not noted during this study. Arousals associated with PLMs were rare.  DIAGNOSIS - Obstructive Sleep Apnea (G47.33)  RECOMMENDATIONS - Trial of Inspire hypoglossal nerve stimulator therapy on 1.4V. - Be careful with alcohol, sedatives and other CNS depressants that may worsen sleep apnea and disrupt normal sleep architecture. - Sleep hygiene should be reviewed to assess factors that may improve sleep quality. - Weight management and regular exercise should be initiated or continued.  [Electronically signed] 02/08/2023 11:25 AM  Jetty Duhamel MD, ABSM Diplomate, American Board of Sleep Medicine NPI: [id number]                        Jetty Duhamel Diplomate, American Board of Sleep Medicine  ELECTRONICALLY SIGNED ON:  02/08/2023, 11:13 AM [facility] PH: [phone]   FX: [phone]  [phone] ACCREDITED BY THE AMERICAN ACADEMY OF SLEEP MEDICINE

## 2023-02-10 ENCOUNTER — Other Ambulatory Visit (HOSPITAL_COMMUNITY): Payer: Self-pay

## 2023-02-11 DIAGNOSIS — F331 Major depressive disorder, recurrent, moderate: Secondary | ICD-10-CM | POA: Diagnosis not present

## 2023-02-28 NOTE — Progress Notes (Deleted)
 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, Risk Manager, followed for OSA , rhinitis Unattended home sleep studyAlice 07/24/13- mild obstructive sleep apnea, AHI 13 per hour, weight 206 pounds NPSG 11/04/21- AHI 55.8/ hr, desaturation to 78% (120 min </= 88%), body weight 215 lbs -----------------------------------------------------------------------------------------------------   09/12/22-  53 year old male never smoker, [employer], followed for OSA, nasal congestion, complicated by low back pain, Depression, rhinitis CPAP auto 4-10/Adapt>> Inspire Body weight today-206 lbs Inspire implantation (Dr Carlie) 07/30/22- For Inspire Activation today- Eric Blackburn, Inspire agent here Incisions are healing well. Wife is here. Activation done. He will advance by 1 level/ week as tolerated. CXR 07/30/22 1V IMPRESSION: New generator overlying the right chest with catheter extending into the right neck. No pneumothorax.  11/10/22- 53 year old male never smoker, [employer], followed for OSA, nasal congestion, complicated by low back pain, Depression, rhinitis CPAP auto 4-10/Adapt>> Inspire Melatonin- Body weight today-204 lbs. Inspire implantation (Dr Carlie) 07/30/22- For Inspire f/u since activation last visit. Now level-8. Ginny here from [place], adjusting. He wore both CPAP and Inspire up to level 6.He is comfortable with no concerns expressed. He is ok to stay at level 8, or advance if he wants to try. Silvano will schedule HST on Inspire- goal AHI is 15 or less.  03/01/22-  53 year old male never smoker, [employer], followed for OSA, nasal congestion, complicated by low back pain, Depression, rhinitis CPAP auto 4-10/Adapt>> Inspire Melatonin- Body weight today-204 lbs. Inspire implantation (Dr Carlie) 07/30/22- Inspire activation 11/10/22 Inspire Titration Sleep Study 02/03/23>> to 1.4V (AHI 2.1/hr)   ROS-see HPI  + = positive Constitutional:   No-   weight loss, night  sweats, fevers, chills, fatigue, lassitude. HEENT:   No-  headaches, difficulty swallowing, tooth/dental problems, sore throat,       No-  sneezing, itching, ear ache, + nasal congestion, post nasal drip,  CV:  No-   chest pain, orthopnea, PND, swelling in lower extremities, anasarca,                                                    dizziness, palpitations Resp: No-   shortness of breath with exertion or at rest.              No-   productive cough,  No non-productive cough,  No- coughing up of blood.              No-   change in color of mucus.  No- wheezing.   Skin: No-   rash or lesions. GI:  No-   heartburn, indigestion, abdominal pain, nausea, vomiting,  GU:  MS:  No-   joint pain or swelling.   Neuro-     nothing unusual Psych:  No- change in mood or affect. + depression or anxiety.  No memory loss.  OBJ- Physical Exam   General- Alert, Oriented, Affect-appropriate, Distress- none acute, medium build Skin- rash-none, lesions- none, excoriation- none Lymphadenopathy- none Head- atraumatic            Eyes- Gross vision intact, PERRLA, conjunctivae and secretions clear            Ears- Hearing, canals-normal            Nose- Clear, no-Septal dev, mucus, polyps, erosion, perforation  Throat- +Small mandible, Mallampati III , mucosa clear , drainage- none, tonsils- atrophic Neck- flexible , trachea midline, no stridor , thyroid  nl, carotid no bruit Chest - symmetrical excursion , unlabored           Heart/CV- RRR , no murmur , no gallop  , no rub, nl s1 s2                           - JVD- none , edema- none, stasis changes- none, varices- none           Lung- clear to P&A, wheeze- none, cough- none , dullness-none, rub- none           Chest wall-  Abd-  Br/ Gen/ Rectal- Not done, not indicated Extrem- cyanosis- none, clubbing, none, atrophy- none, strength- nl Neuro- grossly intact to observation

## 2023-03-02 ENCOUNTER — Encounter: Payer: Self-pay | Admitting: Internal Medicine

## 2023-03-02 ENCOUNTER — Ambulatory Visit: Payer: 59 | Admitting: Internal Medicine

## 2023-03-02 ENCOUNTER — Other Ambulatory Visit (HOSPITAL_COMMUNITY): Payer: Self-pay

## 2023-03-02 VITALS — BP 112/80 | HR 68 | Ht 71.0 in | Wt 213.8 lb

## 2023-03-02 DIAGNOSIS — G4733 Obstructive sleep apnea (adult) (pediatric): Secondary | ICD-10-CM | POA: Diagnosis not present

## 2023-03-02 NOTE — Progress Notes (Signed)
 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 Inspire implantation (Dr Carlie) 07/30/22- For Inspire f/u since activation last visit. Now level-8. Ginny here from [place], adjusting. He wore both CPAP and Inspire up to level 6.He is comfortable with no concerns expressed. He is ok to stay at level 8, or advance if he wants to try. Silvano will schedule HST on Inspire- goal AHI is 15 or less.  03/01/22-  53 year old male never smoker, [employer], followed for OSA, nasal congestion, complicated by low back pain, Depression, rhinitis CPAP auto 4-10/Adapt>> Inspire Melatonin- Body weight today-204 lbs. Inspire implantation (Dr Carlie) 07/30/22- Inspire activation 11/10/22 Inspire Titration Sleep Study 02/03/23>> to 1.4V (AHI 2.1/hr)  Discussed the use of AI scribe software for clinical note transcription with the patient, who gave verbal consent to proceed.  History of Present Illness   The patient, with a history of sleep apnea, presents for a follow-up visit regarding his Inspire device. He reports no changes to the device's settings since his sleep study. Despite using the device, the patient continues to snore, particularly when sleeping on his back. He has also been using CPAP for the past month, which has been somewhat helpful. The patient has not tried a chin strap or mouth tape, but is open to these options. The patient does not report any  discomfort or adverse effects from the device. He does not report feeling more rested, but notes that his snoring primarily affects his partner. Inspire titration sleep study 01/25/23 > AHI 2.1/hr on 1.4V. He is now on 1V and will advance by 0.1V/week to 1.4V as tolerated. Consider earplugs if needed by wife.      ROS-see HPI  + = positive Constitutional:   No-   weight loss, night sweats, fevers, chills, fatigue, lassitude. HEENT:   No-  headaches, difficulty swallowing, tooth/dental problems, sore throat,       No-  sneezing, itching, ear ache, + nasal congestion, post nasal drip,  CV:  No-   chest pain, orthopnea, PND, swelling in lower extremities, anasarca,                                                    dizziness, palpitations Resp: No-   shortness of breath with exertion or at rest.              No-   productive cough,  No non-productive cough,  No- coughing up of blood.              No-   change in color of mucus.  No- wheezing.   Skin: No-   rash or lesions. GI:  No-   heartburn, indigestion, abdominal pain, nausea, vomiting,  GU:  MS:  No-   joint pain or swelling.   Neuro-     nothing unusual Psych:  No- change in mood or affect. + depression or anxiety.  No memory loss.  OBJ- Physical Exam  General- Alert, Oriented, Affect-appropriate, Distress- none acute, medium build Skin- rash-none, lesions- none, excoriation- none Lymphadenopathy- none Head- atraumatic            Eyes- Gross vision intact, PERRLA, conjunctivae and secretions clear            Ears- Hearing, canals-normal            Nose- Clear, no-Septal dev, mucus, polyps, erosion, perforation             Throat- +Small mandible, Mallampati III , mucosa clear , drainage- none, tonsils- atrophic Neck- flexible , trachea midline, no stridor , thyroid  nl, carotid no bruit Chest - symmetrical excursion , unlabored           Heart/CV- RRR , no murmur , no gallop  , no rub, nl s1 s2                           - JVD- none  , edema- none, stasis changes- none, varices- none           Lung- clear to P&A, wheeze- none, cough- none , dullness-none, rub- none           Chest wall-  Abd-  Br/ Gen/ Rectal- Not done, not indicated Extrem- cyanosis- none, clubbing, none, atrophy- none, strength- nl Neuro- grossly intact to observation  Assessment and Plan    Obstructive Sleep Apnea Patient reports persistent snoring despite using Inspire for sleep apnea. Discussed the potential benefit of a chin strap or mouth tape, but no definitive decision made. Sleep study showed a significant reduction in apnea-hypopnea index (AHI) with device use. -Increase device setting from level 1 to level 2, with a plan to gradually increase to level 5 (1.4V) over the next few weeks. -Adjust start delay to 40 minutes and pause time to 20 minutes. -Continue device use for 8 hours per night. -Consider use of chin strap or mouth tape to reduce snoring. -Schedule follow-up appointment in 6 months, or sooner if issues arise.

## 2023-03-02 NOTE — Patient Instructions (Signed)
 We will have holly set up a return here in 6 months.  Build as tolerated to 1.4 V  Duration 8 hours with start delay 40 minutes and pause 20 minutes.

## 2023-03-06 ENCOUNTER — Ambulatory Visit: Payer: 59 | Admitting: Internal Medicine

## 2023-03-11 DIAGNOSIS — G4733 Obstructive sleep apnea (adult) (pediatric): Secondary | ICD-10-CM | POA: Diagnosis not present

## 2023-03-11 DIAGNOSIS — F331 Major depressive disorder, recurrent, moderate: Secondary | ICD-10-CM | POA: Diagnosis not present

## 2023-04-01 ENCOUNTER — Other Ambulatory Visit: Payer: Self-pay

## 2023-04-02 ENCOUNTER — Other Ambulatory Visit (HOSPITAL_COMMUNITY): Payer: Self-pay

## 2023-04-02 MED ORDER — ZOSTER VAC RECOMB ADJUVANTED 50 MCG/0.5ML IM SUSR
0.5000 mL | Freq: Once | INTRAMUSCULAR | 0 refills | Status: AC
  Filled 2023-04-02: qty 0.5, 1d supply, fill #0

## 2023-04-15 DIAGNOSIS — F331 Major depressive disorder, recurrent, moderate: Secondary | ICD-10-CM | POA: Diagnosis not present

## 2023-04-27 ENCOUNTER — Other Ambulatory Visit: Payer: Self-pay | Admitting: Family Medicine

## 2023-04-28 ENCOUNTER — Other Ambulatory Visit (HOSPITAL_COMMUNITY): Payer: Self-pay

## 2023-04-28 MED ORDER — BUPROPION HCL ER (XL) 300 MG PO TB24
300.0000 mg | ORAL_TABLET | Freq: Every day | ORAL | 2 refills | Status: DC
  Filled 2023-04-28: qty 90, 90d supply, fill #0
  Filled 2023-07-27: qty 90, 90d supply, fill #1
  Filled 2023-10-01 – 2023-11-02 (×2): qty 90, 90d supply, fill #2

## 2023-04-29 DIAGNOSIS — H5213 Myopia, bilateral: Secondary | ICD-10-CM | POA: Diagnosis not present

## 2023-04-29 DIAGNOSIS — D225 Melanocytic nevi of trunk: Secondary | ICD-10-CM | POA: Diagnosis not present

## 2023-05-06 DIAGNOSIS — F331 Major depressive disorder, recurrent, moderate: Secondary | ICD-10-CM | POA: Diagnosis not present

## 2023-05-27 DIAGNOSIS — F331 Major depressive disorder, recurrent, moderate: Secondary | ICD-10-CM | POA: Diagnosis not present

## 2023-06-15 ENCOUNTER — Other Ambulatory Visit: Payer: Self-pay | Admitting: Family Medicine

## 2023-06-15 MED ORDER — VALACYCLOVIR HCL 1 G PO TABS
ORAL_TABLET | ORAL | 5 refills | Status: AC
  Filled 2023-06-15: qty 4, 1d supply, fill #0
  Filled 2023-06-25: qty 4, 1d supply, fill #1
  Filled 2023-10-01: qty 4, 1d supply, fill #2
  Filled 2023-11-02: qty 4, 1d supply, fill #3

## 2023-06-15 NOTE — Telephone Encounter (Signed)
 Last filled on 09/24/21 #4 tabs/ 5 refills   CPE was on 01/07/23

## 2023-06-16 ENCOUNTER — Other Ambulatory Visit (HOSPITAL_COMMUNITY): Payer: Self-pay

## 2023-06-17 DIAGNOSIS — F331 Major depressive disorder, recurrent, moderate: Secondary | ICD-10-CM | POA: Diagnosis not present

## 2023-06-25 ENCOUNTER — Other Ambulatory Visit (HOSPITAL_COMMUNITY): Payer: Self-pay

## 2023-07-08 DIAGNOSIS — F331 Major depressive disorder, recurrent, moderate: Secondary | ICD-10-CM | POA: Diagnosis not present

## 2023-07-21 ENCOUNTER — Other Ambulatory Visit (HOSPITAL_COMMUNITY): Payer: Self-pay

## 2023-07-21 ENCOUNTER — Other Ambulatory Visit: Payer: Self-pay

## 2023-07-27 ENCOUNTER — Other Ambulatory Visit (HOSPITAL_COMMUNITY): Payer: Self-pay

## 2023-07-29 ENCOUNTER — Encounter: Payer: Self-pay | Admitting: Internal Medicine

## 2023-07-29 DIAGNOSIS — F331 Major depressive disorder, recurrent, moderate: Secondary | ICD-10-CM | POA: Diagnosis not present

## 2023-07-30 ENCOUNTER — Other Ambulatory Visit (HOSPITAL_COMMUNITY): Payer: Self-pay

## 2023-08-19 DIAGNOSIS — F331 Major depressive disorder, recurrent, moderate: Secondary | ICD-10-CM | POA: Diagnosis not present

## 2023-08-31 ENCOUNTER — Ambulatory Visit: Payer: 59 | Admitting: Internal Medicine

## 2023-09-02 NOTE — Progress Notes (Signed)
 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, Risk manager, followed for OSA , rhinitis Unattended home sleep studyAlice 07/24/13- mild obstructive sleep apnea, AHI 13 per hour, weight 206 pounds NPSG 11/04/21- AHI 55.8/ hr, desaturation to 78% (120 min </= 88%), body weight 215 lbs Inspire Titration Sleep Study 02/03/23>> to 1.4V (AHI 2.1/hr) -----------------------------------------------------------------------------------------------------   03/01/22-  53 year old male never smoker, [employer], followed for OSA, nasal congestion, complicated by low back pain, Depression, rhinitis CPAP auto 4-10/Adapt>> Inspire Melatonin- Body weight today-204 lbs. Inspire implantation (Dr Carlie) 07/30/22- Inspire activation 11/10/22 Inspire Titration Sleep Study 02/03/23>> to 1.4V (AHI 2.1/hr)  Discussed the use of AI scribe software for clinical note transcription with the patient, who gave verbal consent to proceed.  History of Present Illness   The patient, with a history of sleep apnea, presents for a follow-up visit regarding his Inspire device. He reports no changes to the device's settings since his sleep study. Despite using the device, the patient continues to snore, particularly when sleeping on his back. He has also been using CPAP for the past month, which has been somewhat helpful. The patient has not tried a chin strap or mouth tape, but is open to these options. The patient does not report any discomfort or adverse effects from the device. He does not report feeling more rested, but notes that his snoring primarily affects his partner. Inspire titration sleep study 01/25/23 > AHI 2.1/hr on 1.4V. He is now on 1V and will advance by 0.1V/week to 1.4V as tolerated. Consider earplugs if needed by wife.    Assessment and Plan:    Obstructive Sleep Apnea Patient reports persistent snoring despite using Inspire for sleep apnea. Discussed the potential benefit of a chin strap or mouth tape, but no  definitive decision made. Sleep study showed a significant reduction in apnea-hypopnea index (AHI) with device use. -Increase device setting from level 1 to level 2, with a plan to gradually increase to level 5 (1.4V) over the next few weeks. -Adjust start delay to 40 minutes and pause time to 20 minutes. -Continue device use for 8 hours per night. -Consider use of chin strap or mouth tape to reduce snoring. -Schedule follow-up appointment in 6 months, or sooner if issues arise.   09/03/23- 53 year old male never smoker, [employer], followed for OSA, nasal congestion, complicated by low back pain, Depression, rhinitis CPAP auto 4-10/Adapt>> Inspire Melatonin- Body weight today- Inspire implantation (Dr Carlie) 07/30/22- Inspire activation 11/10/22 Inspire Titration Sleep Study 02/03/23>> to 1.4V (AHI 2.1/hr) Inspire f/u- we had increased start delay and pause times, suggested measures to reduce snoring. Reports comfortable with this voltage, sleeping ok Inspire tech suggests Home sleep test in 6 months, then office f/u. History of Present Illness  ROS-see HPI  + = positive Constitutional:   No-   weight loss, night sweats, fevers, chills, fatigue, lassitude. HEENT:   No-  headaches, difficulty swallowing, tooth/dental problems, sore throat,       No-  sneezing, itching, ear ache, + nasal congestion, post nasal drip,  CV:  No-   chest pain, orthopnea, PND, swelling in lower extremities, anasarca,                                                    dizziness, palpitations Resp: No-   shortness of breath with exertion or at  rest.              No-   productive cough,  No non-productive cough,  No- coughing up of blood.              No-   change in color of mucus.  No- wheezing.   Skin: No-   rash or lesions. GI:  No-   heartburn, indigestion, abdominal pain, nausea, vomiting,  GU:  MS:  No-   joint pain or swelling.   Neuro-     nothing unusual Psych:  No- change in mood or affect.  + depression or anxiety.  No memory loss.  OBJ- Physical Exam   General- Alert, Oriented, Affect-appropriate, Distress- none acute, medium build Skin- rash-none, lesions- none, excoriation- none Lymphadenopathy- none Head- atraumatic            Eyes- Gross vision intact, PERRLA, conjunctivae and secretions clear            Ears- Hearing, canals-normal            Nose- Clear, no-Septal dev, mucus, polyps, erosion, perforation             Throat- +Small mandible, Mallampati III , mucosa clear , drainage- none, tonsils- atrophic Neck- flexible , trachea midline, no stridor , thyroid  nl, carotid no bruit Chest - symmetrical excursion , unlabored           Heart/CV- RRR , no murmur , no gallop  , no rub, nl s1 s2                           - JVD- none , edema- none, stasis changes- none, varices- none           Lung- clear to P&A, wheeze- none, cough- none , dullness-none, rub- none           Chest wall-  Abd-  Br/ Gen/ Rectal- Not done, not indicated Extrem- cyanosis- none, clubbing, none, atrophy- none, strength- nl Neuro- grossly intact to observation

## 2023-09-03 ENCOUNTER — Encounter: Payer: Self-pay | Admitting: Internal Medicine

## 2023-09-03 ENCOUNTER — Ambulatory Visit: Admitting: Internal Medicine

## 2023-09-03 VITALS — BP 110/78 | HR 65 | Temp 98.0°F | Ht 71.0 in | Wt 208.0 lb

## 2023-09-03 DIAGNOSIS — G4733 Obstructive sleep apnea (adult) (pediatric): Secondary | ICD-10-CM | POA: Diagnosis not present

## 2023-09-03 NOTE — Patient Instructions (Signed)
 Order- HST schedule 6 months from now- Inspire assessment for OSA  We will get you back after that test for office follow-up.

## 2023-09-17 NOTE — Assessment & Plan Note (Signed)
 Inspire voltage now at 1.4V or as tolerated Plan- schedule HST for Inspire f/u in 6 months, then return for office visit about 2 weeks after.

## 2023-09-22 NOTE — Procedures (Signed)
Mask fit

## 2023-09-23 ENCOUNTER — Other Ambulatory Visit: Payer: Self-pay

## 2023-09-23 DIAGNOSIS — F331 Major depressive disorder, recurrent, moderate: Secondary | ICD-10-CM | POA: Diagnosis not present

## 2023-10-01 ENCOUNTER — Other Ambulatory Visit: Payer: Self-pay

## 2023-10-01 ENCOUNTER — Other Ambulatory Visit (HOSPITAL_COMMUNITY): Payer: Self-pay

## 2023-10-07 DIAGNOSIS — F332 Major depressive disorder, recurrent severe without psychotic features: Secondary | ICD-10-CM | POA: Diagnosis not present

## 2023-10-21 DIAGNOSIS — F331 Major depressive disorder, recurrent, moderate: Secondary | ICD-10-CM | POA: Diagnosis not present

## 2023-10-28 DIAGNOSIS — F332 Major depressive disorder, recurrent severe without psychotic features: Secondary | ICD-10-CM | POA: Diagnosis not present

## 2023-11-02 DIAGNOSIS — F332 Major depressive disorder, recurrent severe without psychotic features: Secondary | ICD-10-CM | POA: Diagnosis not present

## 2023-11-03 ENCOUNTER — Other Ambulatory Visit (HOSPITAL_COMMUNITY): Payer: Self-pay

## 2023-11-04 DIAGNOSIS — F332 Major depressive disorder, recurrent severe without psychotic features: Secondary | ICD-10-CM | POA: Diagnosis not present

## 2023-11-10 DIAGNOSIS — F332 Major depressive disorder, recurrent severe without psychotic features: Secondary | ICD-10-CM | POA: Diagnosis not present

## 2023-11-11 DIAGNOSIS — F332 Major depressive disorder, recurrent severe without psychotic features: Secondary | ICD-10-CM | POA: Diagnosis not present

## 2023-11-17 ENCOUNTER — Other Ambulatory Visit (HOSPITAL_COMMUNITY): Payer: Self-pay

## 2023-11-17 DIAGNOSIS — F332 Major depressive disorder, recurrent severe without psychotic features: Secondary | ICD-10-CM | POA: Diagnosis not present

## 2023-11-17 MED ORDER — COVID-19 MRNA VAC-TRIS(PFIZER) 30 MCG/0.3ML IM SUSY
0.3000 mL | PREFILLED_SYRINGE | Freq: Once | INTRAMUSCULAR | 0 refills | Status: AC
  Filled 2023-11-17: qty 0.3, 1d supply, fill #0

## 2023-11-18 DIAGNOSIS — F331 Major depressive disorder, recurrent, moderate: Secondary | ICD-10-CM | POA: Diagnosis not present

## 2023-11-20 DIAGNOSIS — F332 Major depressive disorder, recurrent severe without psychotic features: Secondary | ICD-10-CM | POA: Diagnosis not present

## 2023-11-24 DIAGNOSIS — F332 Major depressive disorder, recurrent severe without psychotic features: Secondary | ICD-10-CM | POA: Diagnosis not present

## 2023-12-01 DIAGNOSIS — F332 Major depressive disorder, recurrent severe without psychotic features: Secondary | ICD-10-CM | POA: Diagnosis not present

## 2023-12-08 DIAGNOSIS — F332 Major depressive disorder, recurrent severe without psychotic features: Secondary | ICD-10-CM | POA: Diagnosis not present

## 2023-12-22 ENCOUNTER — Other Ambulatory Visit (HOSPITAL_COMMUNITY): Payer: Self-pay

## 2024-01-13 ENCOUNTER — Encounter: Payer: Self-pay | Admitting: Podiatry

## 2024-01-13 ENCOUNTER — Ambulatory Visit: Admitting: Podiatry

## 2024-01-13 VITALS — Ht 71.0 in | Wt 208.0 lb

## 2024-01-13 DIAGNOSIS — M7741 Metatarsalgia, right foot: Secondary | ICD-10-CM | POA: Diagnosis not present

## 2024-01-13 DIAGNOSIS — M205X2 Other deformities of toe(s) (acquired), left foot: Secondary | ICD-10-CM

## 2024-01-13 NOTE — Progress Notes (Signed)
   Chief Complaint  Patient presents with   Foot Orthotics    Pt is here to discuss getting new orthotics.    HPI: 53 y.o. male presenting today for follow-up evaluation of hallux limitus with metatarsalgia to the left foot.  He states that the orthotics helped significantly.  He is requesting another pair.  His are about 39.54 years old and beginning to breakdown  Past Medical History:  Diagnosis Date   Depression    on meds   Fever blister    on meds   Hx of sessile serrated colonic polyps 04/10/2020   Sleep apnea    uses CPAP   Substance abuse (HCC)    Hx ETOH    Past Surgical History:  Procedure Laterality Date   ANTERIOR CRUCIATE LIGAMENT REPAIR Right 1992   COLONOSCOPY  2022   DRUG INDUCED ENDOSCOPY N/A 01/08/2022   Procedure: DRUG INDUCED ENDOSCOPY;  Surgeon: Carlie Clark, MD;  Location:  SURGERY CENTER;  Service: ENT;  Laterality: N/A;   HERNIA REPAIR Bilateral    x 2 as a child   IMPLANTATION OF HYPOGLOSSAL NERVE STIMULATOR N/A 07/30/2022   Procedure: IMPLANTATION OF HYPOGLOSSAL NERVE STIMULATOR;  Surgeon: Carlie Clark, MD;  Location: [facility] OR;  Service: ENT;  Laterality: N/A;    No Known Allergies   Physical Exam: General: The patient is alert and oriented x3 in no acute distress.  Dermatology: Skin is warm, dry and supple bilateral lower extremities.   Vascular: Palpable pedal pulses bilaterally. Capillary refill within normal limits.  No appreciable edema.  No erythema.  Neurological: Grossly intact via light touch  Musculoskeletal Exam: Chronic metatarsalgia of the right forefoot.  Generalized pain along the ball of the foot.  There is also some tenderness with palpation and range of motion to the left great toe joint consistent with hallux limitus  Radiographic Exam B/L feet 10/29/2022:  Normal osseous mineralization.  No acute fractures identified.  Degenerative changes noted to the left great toe joint.  Assessment/Plan of Care: 1. Metatarsalgia  RT  2.  Hallux limitus LT  -Patient evaluated.   - We will go ahead and order new orthotics for the patient with minor adjustments from his current orthotics;  1/16 additional padded top-cover to provide more cushion to the forefoot.  Wider forefoot; his forefoot actually hangs over the edges of the orthotics Maintain standard 10mm heel cup but make the rearfoot as low profile as possible  Maintain metatarsal pad and heel 'punch-out' -Message sent to the orthotics department for new order of orthotics w/ existing scan/mold that was obtained 01/13/2023 -Return to clinic with me PRN  Thresa EMERSON Sar, DPM [facility]  Dr. Thresa EMERSON Sar, DPM    [address] [place], [place] [address]                Office [phone]  Fax [phone]

## 2024-01-18 ENCOUNTER — Encounter: Payer: Self-pay | Admitting: Family Medicine

## 2024-01-18 DIAGNOSIS — Z125 Encounter for screening for malignant neoplasm of prostate: Secondary | ICD-10-CM

## 2024-01-18 DIAGNOSIS — R6882 Decreased libido: Secondary | ICD-10-CM

## 2024-01-18 DIAGNOSIS — Z0000 Encounter for general adult medical examination without abnormal findings: Secondary | ICD-10-CM

## 2024-01-21 DIAGNOSIS — R6882 Decreased libido: Secondary | ICD-10-CM | POA: Insufficient documentation

## 2024-01-21 MED ORDER — RSVPREF3 VAC RECOMB ADJUVANTED 120 MCG/0.5ML IM SUSR
0.5000 mL | Freq: Once | INTRAMUSCULAR | 0 refills | Status: AC
  Filled 2024-01-21: qty 0.5, 1d supply, fill #0

## 2024-01-22 ENCOUNTER — Other Ambulatory Visit (HOSPITAL_COMMUNITY): Payer: Self-pay

## 2024-01-25 ENCOUNTER — Other Ambulatory Visit: Payer: Self-pay | Admitting: Family Medicine

## 2024-01-26 ENCOUNTER — Other Ambulatory Visit: Payer: Self-pay

## 2024-01-26 ENCOUNTER — Other Ambulatory Visit (HOSPITAL_COMMUNITY): Payer: Self-pay

## 2024-01-26 MED ORDER — BUPROPION HCL ER (XL) 300 MG PO TB24
300.0000 mg | ORAL_TABLET | Freq: Every day | ORAL | 0 refills | Status: AC
  Filled 2024-01-26: qty 90, 90d supply, fill #0

## 2024-01-28 ENCOUNTER — Other Ambulatory Visit (HOSPITAL_COMMUNITY): Payer: Self-pay

## 2024-01-28 MED ORDER — AREXVY 120 MCG/0.5ML IM SUSR
0.5000 mL | Freq: Once | INTRAMUSCULAR | 0 refills | Status: AC
  Filled 2024-01-28: qty 0.5, 1d supply, fill #0

## 2024-02-15 ENCOUNTER — Other Ambulatory Visit (HOSPITAL_COMMUNITY): Payer: Self-pay

## 2024-02-16 ENCOUNTER — Encounter: Payer: Self-pay | Admitting: Family Medicine

## 2024-02-16 ENCOUNTER — Other Ambulatory Visit (HOSPITAL_COMMUNITY): Payer: Self-pay

## 2024-02-16 ENCOUNTER — Ambulatory Visit (INDEPENDENT_AMBULATORY_CARE_PROVIDER_SITE_OTHER): Admitting: Family Medicine

## 2024-02-16 ENCOUNTER — Other Ambulatory Visit: Payer: Self-pay

## 2024-02-16 VITALS — BP 128/62 | HR 62 | Temp 97.6°F | Ht 71.0 in | Wt 207.5 lb

## 2024-02-16 DIAGNOSIS — Z23 Encounter for immunization: Secondary | ICD-10-CM

## 2024-02-16 DIAGNOSIS — F418 Other specified anxiety disorders: Secondary | ICD-10-CM | POA: Diagnosis not present

## 2024-02-16 DIAGNOSIS — L989 Disorder of the skin and subcutaneous tissue, unspecified: Secondary | ICD-10-CM | POA: Insufficient documentation

## 2024-02-16 DIAGNOSIS — Z0000 Encounter for general adult medical examination without abnormal findings: Secondary | ICD-10-CM

## 2024-02-16 DIAGNOSIS — R6882 Decreased libido: Secondary | ICD-10-CM | POA: Diagnosis not present

## 2024-02-16 DIAGNOSIS — G4733 Obstructive sleep apnea (adult) (pediatric): Secondary | ICD-10-CM

## 2024-02-16 DIAGNOSIS — Z1159 Encounter for screening for other viral diseases: Secondary | ICD-10-CM | POA: Diagnosis not present

## 2024-02-16 DIAGNOSIS — Z114 Encounter for screening for human immunodeficiency virus [HIV]: Secondary | ICD-10-CM | POA: Insufficient documentation

## 2024-02-16 DIAGNOSIS — Z125 Encounter for screening for malignant neoplasm of prostate: Secondary | ICD-10-CM

## 2024-02-16 MED ORDER — KETOCONAZOLE 2 % EX CREA
1.0000 | TOPICAL_CREAM | Freq: Every day | CUTANEOUS | 0 refills | Status: AC
  Filled 2024-02-16: qty 15, 15d supply, fill #0

## 2024-02-16 NOTE — Assessment & Plan Note (Signed)
 Area of erythema with slight raise border on head of penis to the left Suspect fungal Spending more time in gym/sweating  No std exposure  Ketoconazole  cream 2% prescribed for bid use  Encouraged to keep area clean and dry   Update if not starting to improve in a week or if worsening  Call back and Er precautions noted in detail today

## 2024-02-16 NOTE — Assessment & Plan Note (Signed)
Hep C screen today 

## 2024-02-16 NOTE — Assessment & Plan Note (Signed)
 Psa added to labs No clinical changes

## 2024-02-16 NOTE — Patient Instructions (Signed)
 Labs today   Keep up the good work with diet and exercise   Continue your counseling   Try the ketoconazole  cream on the affected skin area  Let us  know if not improved in the next 2 weeks    Tdap vaccine today

## 2024-02-16 NOTE — Assessment & Plan Note (Signed)
 Doing well with the aspire implant

## 2024-02-16 NOTE — Assessment & Plan Note (Signed)
Testosterone level added to labs.

## 2024-02-16 NOTE — Assessment & Plan Note (Signed)
-

## 2024-02-16 NOTE — Assessment & Plan Note (Signed)
 PHQ 1 Doing better with current medication and counseling Lexapro  20  Wellbutrin  xl 300 mg daily

## 2024-02-16 NOTE — Assessment & Plan Note (Signed)
 Reviewed health habits including diet and exercise and skin cancer prevention Reviewed appropriate screening tests for age  Also reviewed health mt list, fam hx and immunization status , as well as social and family history   See HPI Labs reviewed and ordered Health Maintenance  Topic Date Due   HIV Screening  Never done   Hepatitis C Screening  Never done   Hepatitis B Vaccine (1 of 3 - 19+ 3-dose series) Never done   Pneumococcal Vaccine for age over 56 (1 of 1 - PCV) Never done   Colon Cancer Screening  04/10/2025   DTaP/Tdap/Td vaccine (3 - Td or Tdap) 02/15/2034   Flu Shot  Completed   COVID-19 Vaccine  Completed   Zoster (Shingles) Vaccine  Completed   HPV Vaccine  Aged Out   Meningitis B Vaccine  Aged Out    Labs today  Tdap updated  Hiv and hep C screening added  PSA for prostate cancer screening  Discussed fall prevention, supplements and exercise for bone density  Some improvement in mental health with medication and counseling   PHQ 1

## 2024-02-16 NOTE — Progress Notes (Signed)
 "  Subjective:    Patient ID: Eric Blackburn, male    DOB: 09/19/1970, 53 y.o.   MRN: [id number]  HPI  Here for health maintenance exam and to review chronic medical problems   Wt Readings from Last 3 Encounters:  02/16/24 207 lb 8 oz (94.1 kg)  01/13/24 208 lb (94.3 kg)  09/03/23 208 lb (94.3 kg)   28.94 kg/m  Vitals:   02/16/24 1410  BP: 128/62  Pulse: 62  Temp: 97.6 F (36.4 C)  SpO2: 97%    Immunization History  Administered Date(s) Administered   Influenza Split 11/24/2013, 10/26/2014, 11/25/2015   Influenza,inj,Quad PF,6+ Mos 11/13/2016, 12/04/2020, 11/18/2021   Influenza,inj,quad, With Preservative 11/25/2018   Influenza-Unspecified 11/24/2012, 11/08/2017, 12/13/2018, 11/09/2019, 11/09/2022, 11/24/2023   PFIZER Comirnaty (Gray Top)Covid-19 Tri-Sucrose Vaccine 12/05/2021   PFIZER(Purple Top)SARS-COV-2 Vaccination 11/05/2018, 11/26/2018, 12/09/2019   Pfizer(Comirnaty )Fall Seasonal Vaccine 12 years and older 01/29/2023, 11/17/2023   Tdap 05/25/2013, 02/16/2024   Zoster Recombinant(Shingrix ) 12/19/2022, 04/02/2023    Health Maintenance Due  Topic Date Due   HIV Screening  Never done   Hepatitis C Screening  Never done   Hepatitis B Vaccines 19-59 Average Risk (1 of 3 - 19+ 3-dose series) Never done   Pneumococcal Vaccine: 50+ Years (1 of 1 - PCV) Never done   Just got [place] from [place]  Is tired    Flu shot -in sept   Tdap 05/2013 Wants to update   We sent RSV vaccine- was not covered? May try again    Interested in hiv/hep C  Prostate health Lab Results  Component Value Date   PSA 2.80 03/14/2022   PSA 3.11 09/18/2021   PSA 2.21 01/10/2020  No urinary changes   Has a small spot on penis -2 months  Not raised / circular  Not itchy or sore  Not currently sexually active  Tried hydrocortisone on it  Has not tried anti fungal    Colon cancer screening  Colonoscopy 03/2020 with 5 y recall   Bone health   Falls-none  Fractures-none   Supplements mvi    Exercise -back to going to the gym  Cardio and weights and stretching   Derm care  Goes once per year    Mood    02/16/2024    2:16 PM 01/07/2023    2:30 PM 01/28/2022    2:06 PM 09/17/2021   11:26 AM 09/12/2020    3:06 PM  Depression screen PHQ 2/9  Decreased Interest 0 0 1 2 1   Down, Depressed, Hopeless 0 1 2 2 2   PHQ - 2 Score 0 1 3 4 3   Altered sleeping 0 1 0 1 2  Tired, decreased energy 0 0 1 2 2   Change in appetite 0 0 2 2 3   Feeling bad or failure about yourself  0 0 0 0 1  Trouble concentrating 1 0 0 1 1  Moving slowly or fidgety/restless 0 0 0 0 0  Suicidal thoughts 0 0 0 0 0  PHQ-9 Score 1 2  6  10  12    Difficult doing work/chores Not difficult at all Not difficult at all  Somewhat difficult Somewhat difficult     Data saved with a previous flowsheet row definition   Lexapro  20 mg daily  Wellburin xl 300 mg daily   Training some new employees  Wife's health issues  Anxious and tired   Started spravato in the summer ([employer]) -[place] -doing well with this   Once weekly -  doing better with it  Helping mood and alcohol cravings   Also doing counseling once a month       Patient Active Problem List   Diagnosis Date Noted   Encounter for hepatitis C screening test for low risk patient 02/16/2024   Encounter for screening for HIV 02/16/2024   Skin lesion 02/16/2024   Libido, decreased 01/21/2024   History of alcohol use 01/28/2022   Hx of sessile serrated colonic polyps 04/10/2020   Hyperkalemia 12/24/2016   Prostate cancer screening 09/10/2015   Obstructive sleep apnea 05/25/2013   Routine general medical examination at a health care facility 05/16/2013   Depression with anxiety 03/31/2007   HYPOSPADIAS 03/31/2007   Past Medical History:  Diagnosis Date   Anxiety 1999   Depression 1999   on meds   Fever blister    on meds   Hx of sessile serrated colonic polyps 04/10/2020   Sleep apnea    uses CPAP    Substance abuse (HCC)    Hx ETOH   Past Surgical History:  Procedure Laterality Date   ANTERIOR CRUCIATE LIGAMENT REPAIR Right 1992   COLONOSCOPY  2022   DRUG INDUCED ENDOSCOPY N/A 01/08/2022   Procedure: DRUG INDUCED ENDOSCOPY;  Surgeon: Carlie Clark, MD;  Location: [facility];  Service: ENT;  Laterality: N/A;   HERNIA REPAIR Bilateral    x 2 as a child   IMPLANTATION OF HYPOGLOSSAL NERVE STIMULATOR N/A 07/30/2022   Procedure: IMPLANTATION OF HYPOGLOSSAL NERVE STIMULATOR;  Surgeon: Carlie Clark, MD;  Location: [facility] OR;  Service: ENT;  Laterality: N/A;   Social History[1] Family History  Problem Relation Age of Onset   Depression Mother    Diabetes type II Mother    Pulmonary Hypertension Mother    Diabetes Mother    Stroke Sister 67       hemorrhagic post procedure   Anxiety disorder Sister    Depression Sister    Asthma Sister        developed as a child   Lung cancer Sister 73       small cell   Cancer Sister    Emphysema Maternal Grandfather    Prostate cancer Maternal Grandfather    Alcohol abuse Maternal Grandfather    COPD Maternal Grandfather    Cancer Maternal Grandfather    Lymphoma Paternal Grandmother    Cancer Paternal Aunt    Colon polyps Neg Hx    Colon cancer Neg Hx    Esophageal cancer Neg Hx    Stomach cancer Neg Hx    Rectal cancer Neg Hx    Allergies[2] Medications Ordered Prior to Encounter[3]  Review of Systems  Constitutional:  Negative for activity change, appetite change, fatigue, fever and unexpected weight change.  HENT:  Negative for congestion, rhinorrhea, sore throat and trouble swallowing.   Eyes:  Negative for pain, redness, itching and visual disturbance.  Respiratory:  Negative for cough, chest tightness, shortness of breath and wheezing.   Cardiovascular:  Negative for chest pain and palpitations.  Gastrointestinal:  Negative for abdominal pain, blood in stool, constipation, diarrhea and nausea.  Endocrine: Negative  for cold intolerance, heat intolerance, polydipsia and polyuria.  Genitourinary:  Negative for difficulty urinating, dysuria, frequency and urgency.       Decreased libido   Musculoskeletal:  Negative for arthralgias, joint swelling and myalgias.  Skin:  Negative for pallor and rash.  Neurological:  Negative for dizziness, tremors, weakness, numbness and headaches.  Hematological:  Negative for  adenopathy. Does not bruise/bleed easily.  Psychiatric/Behavioral:  Positive for dysphoric mood. Negative for decreased concentration. The patient is not nervous/anxious.        Objective:   Physical Exam Constitutional:      General: He is not in acute distress.    Appearance: Normal appearance. He is well-developed and normal weight. He is not ill-appearing or diaphoretic.  HENT:     Head: Normocephalic and atraumatic.     Right Ear: Tympanic membrane, ear canal and external ear normal.     Left Ear: Tympanic membrane, ear canal and external ear normal.     Nose: Nose normal. No congestion.     Mouth/Throat:     Mouth: Mucous membranes are moist.     Pharynx: Oropharynx is clear. No posterior oropharyngeal erythema.  Eyes:     General: No scleral icterus.       Right eye: No discharge.        Left eye: No discharge.     Conjunctiva/sclera: Conjunctivae normal.     Pupils: Pupils are equal, round, and reactive to light.  Neck:     Thyroid : No thyromegaly.     Vascular: No carotid bruit or JVD.  Cardiovascular:     Rate and Rhythm: Normal rate and regular rhythm.     Pulses: Normal pulses.     Heart sounds: Normal heart sounds.     No gallop.  Pulmonary:     Effort: Pulmonary effort is normal. No respiratory distress.     Breath sounds: Normal breath sounds. No wheezing or rales.     Comments: Good air exch Chest:     Chest wall: No tenderness.  Abdominal:     General: Bowel sounds are normal. There is no distension or abdominal bruit.     Palpations: Abdomen is soft. There is no  mass.     Tenderness: There is no abdominal tenderness.     Hernia: No hernia is present.  Musculoskeletal:        General: No tenderness.     Cervical back: Normal range of motion and neck supple. No rigidity. No muscular tenderness.     Right lower leg: No edema.     Left lower leg: No edema.  Lymphadenopathy:     Cervical: No cervical adenopathy.  Skin:    General: Skin is warm and dry.     Coloration: Skin is not pale.     Findings: No erythema or rash.     Comments: Solar lentigines diffusely   Neurological:     Mental Status: He is alert.     Cranial Nerves: No cranial nerve deficit.     Motor: No abnormal muscle tone.     Coordination: Coordination normal.     Gait: Gait normal.     Deep Tendon Reflexes: Reflexes are normal and symmetric. Reflexes normal.  Psychiatric:        Mood and Affect: Mood normal.        Cognition and Memory: Cognition and memory normal.     Comments: Pleasant / talkative            Assessment & Plan:   Problem List Items Addressed This Visit       Respiratory   Obstructive sleep apnea   Doing well with the aspire implant        Musculoskeletal and Integument   Skin lesion   Area of erythema with slight raise border on head of penis to the left Suspect  fungal Spending more time in gym/sweating  No std exposure  Ketoconazole  cream 2% prescribed for bid use  Encouraged to keep area clean and dry   Update if not starting to improve in a week or if worsening  Call back and Er precautions noted in detail today          Other   Routine general medical examination at a health care facility - Primary   Reviewed health habits including diet and exercise and skin cancer prevention Reviewed appropriate screening tests for age  Also reviewed health mt list, fam hx and immunization status , as well as social and family history   See HPI Labs reviewed and ordered Health Maintenance  Topic Date Due   HIV Screening  Never done    Hepatitis C Screening  Never done   Hepatitis B Vaccine (1 of 3 - 19+ 3-dose series) Never done   Pneumococcal Vaccine for age over 26 (1 of 1 - PCV) Never done   Colon Cancer Screening  04/10/2025   DTaP/Tdap/Td vaccine (3 - Td or Tdap) 02/15/2034   Flu Shot  Completed   COVID-19 Vaccine  Completed   Zoster (Shingles) Vaccine  Completed   HPV Vaccine  Aged Out   Meningitis B Vaccine  Aged Out    Labs today  Tdap updated  Hiv and hep C screening added  PSA for prostate cancer screening  Discussed fall prevention, supplements and exercise for bone density  Some improvement in mental health with medication and counseling   PHQ 1       Prostate cancer screening   Psa added to labs  No clinical changes       Libido, decreased   Testosterone  level added to labs       Encounter for screening for HIV   HIV screen today      Relevant Orders   HIV Antibody (routine testing w rflx)   Encounter for hepatitis C screening test for low risk patient   Hep C screen today      Relevant Orders   Hepatitis C Antibody   Depression with anxiety   PHQ 1 Doing better with current medication and counseling Lexapro  20  Wellbutrin  xl 300 mg daily      Other Visit Diagnoses       Need for Tdap vaccination       Relevant Orders   Tdap vaccine greater than or equal to 7yo IM (Completed)         [1]  Social History Tobacco Use   Smoking status: Never    Passive exposure: Past   Smokeless tobacco: Never  Vaping Use   Vaping status: Never Used  Substance Use Topics   Alcohol use: Not Currently    Alcohol/week: 1.0 standard drink of alcohol    Comment: one per week   Drug use: Never  [2] No Known Allergies [3]  Current Outpatient Medications on File Prior to Visit  Medication Sig Dispense Refill   buPROPion  (WELLBUTRIN  XL) 300 MG 24 hr tablet Take 1 tablet (300 mg total) by mouth daily. 90 tablet 0   escitalopram  (LEXAPRO ) 20 MG tablet Take 1 tablet (20 mg total) by mouth  daily. 90 tablet 3   fexofenadine (ALLEGRA) 180 MG tablet Take 180 mg by mouth daily.     melatonin 5 MG TABS Take 10 mg by mouth at bedtime.     Multiple Vitamin (MULTIVITAMIN) capsule Take 1 capsule by mouth daily.  naproxen  sodium (ALEVE ) 220 MG tablet Take 220 mg by mouth 2 (two) times daily as needed.     Omega-3 Fatty Acids (FISH OIL) 1000 MG CAPS Take 1 capsule by mouth 2 (two) times daily.     valACYclovir  (VALTREX ) 1000 MG tablet TAKE 2 TABLETS BY MOUTH TWO TIMES A DAY FOR 1 DAY FOR COLD SORE AS NEEDED. 4 tablet 5   No current facility-administered medications on file prior to visit.   "

## 2024-02-17 ENCOUNTER — Ambulatory Visit: Payer: Self-pay | Admitting: Family Medicine

## 2024-02-17 DIAGNOSIS — L989 Disorder of the skin and subcutaneous tissue, unspecified: Secondary | ICD-10-CM

## 2024-02-17 LAB — CBC WITH DIFFERENTIAL/PLATELET
Basophils Absolute: 0 K/uL (ref 0.0–0.1)
Basophils Relative: 0 % (ref 0.0–3.0)
Eosinophils Absolute: 0.4 K/uL (ref 0.0–0.7)
Eosinophils Relative: 7.3 % — ABNORMAL HIGH (ref 0.0–5.0)
HCT: 41.9 % (ref 39.0–52.0)
Hemoglobin: 14 g/dL (ref 13.0–17.0)
Lymphocytes Relative: 45.9 % (ref 12.0–46.0)
Lymphs Abs: 2.2 K/uL (ref 0.7–4.0)
MCHC: 33.4 g/dL (ref 30.0–36.0)
MCV: 90.5 fl (ref 78.0–100.0)
Monocytes Absolute: 0.8 K/uL (ref 0.1–1.0)
Monocytes Relative: 16.9 % — ABNORMAL HIGH (ref 3.0–12.0)
Neutro Abs: 1.5 K/uL (ref 1.4–7.7)
Neutrophils Relative %: 29.9 % — ABNORMAL LOW (ref 43.0–77.0)
Platelets: 217 K/uL (ref 150.0–400.0)
RBC: 4.63 Mil/uL (ref 4.22–5.81)
RDW: 12.7 % (ref 11.5–15.5)
WBC: 4.9 K/uL (ref 4.0–10.5)

## 2024-02-17 LAB — TESTOSTERONE: Testosterone: 225.14 ng/dL — ABNORMAL LOW (ref 300.00–890.00)

## 2024-02-17 LAB — HEPATITIS C ANTIBODY: Hepatitis C Ab: NONREACTIVE

## 2024-02-17 LAB — LIPID PANEL
Cholesterol: 190 mg/dL (ref 28–200)
HDL: 60.8 mg/dL
LDL Cholesterol: 113 mg/dL — ABNORMAL HIGH (ref 10–99)
NonHDL: 129.08
Total CHOL/HDL Ratio: 3
Triglycerides: 80 mg/dL (ref 10.0–149.0)
VLDL: 16 mg/dL (ref 0.0–40.0)

## 2024-02-17 LAB — COMPREHENSIVE METABOLIC PANEL WITH GFR
ALT: 39 U/L (ref 3–53)
AST: 33 U/L (ref 5–37)
Albumin: 4.6 g/dL (ref 3.5–5.2)
Alkaline Phosphatase: 46 U/L (ref 39–117)
BUN: 12 mg/dL (ref 6–23)
CO2: 29 meq/L (ref 19–32)
Calcium: 9.3 mg/dL (ref 8.4–10.5)
Chloride: 97 meq/L (ref 96–112)
Creatinine, Ser: 0.95 mg/dL (ref 0.40–1.50)
GFR: 90.63 mL/min
Glucose, Bld: 85 mg/dL (ref 70–99)
Potassium: 4.7 meq/L (ref 3.5–5.1)
Sodium: 134 meq/L — ABNORMAL LOW (ref 135–145)
Total Bilirubin: 0.4 mg/dL (ref 0.2–1.2)
Total Protein: 6.7 g/dL (ref 6.0–8.3)

## 2024-02-17 LAB — HIV ANTIBODY (ROUTINE TESTING W REFLEX)
HIV 1&2 Ab, 4th Generation: NONREACTIVE
HIV FINAL INTERPRETATION: NEGATIVE

## 2024-02-17 LAB — PSA: PSA: 3.08 ng/mL (ref 0.10–4.00)

## 2024-02-17 LAB — TSH: TSH: 2.14 u[IU]/mL (ref 0.35–5.50)

## 2024-02-22 ENCOUNTER — Encounter: Payer: Self-pay | Admitting: Podiatry

## 2024-03-15 ENCOUNTER — Encounter (HOSPITAL_COMMUNITY): Payer: Self-pay

## 2024-03-15 ENCOUNTER — Other Ambulatory Visit (HOSPITAL_COMMUNITY): Payer: Self-pay

## 2024-03-15 ENCOUNTER — Other Ambulatory Visit: Payer: Self-pay | Admitting: Family Medicine

## 2024-03-15 MED ORDER — ESCITALOPRAM OXALATE 20 MG PO TABS
20.0000 mg | ORAL_TABLET | Freq: Every day | ORAL | 3 refills | Status: AC
  Filled 2024-03-15: qty 45, 45d supply, fill #0

## 2024-03-16 ENCOUNTER — Other Ambulatory Visit: Payer: Self-pay

## 2024-03-24 ENCOUNTER — Ambulatory Visit: Admitting: Dermatology

## 2024-03-30 ENCOUNTER — Other Ambulatory Visit (HOSPITAL_COMMUNITY): Payer: Self-pay

## 2024-03-31 ENCOUNTER — Telehealth (HOSPITAL_BASED_OUTPATIENT_CLINIC_OR_DEPARTMENT_OTHER): Payer: Self-pay | Admitting: Pulmonary Disease

## 2024-03-31 DIAGNOSIS — Z9682 Presence of neurostimulator: Secondary | ICD-10-CM

## 2024-03-31 DIAGNOSIS — G4733 Obstructive sleep apnea (adult) (pediatric): Secondary | ICD-10-CM

## 2024-03-31 NOTE — Telephone Encounter (Signed)
 HST has been ordered. Please confirm that patient is using his inspire device nightly and that he has been able to increase to level 4-5

## 2024-03-31 NOTE — Telephone Encounter (Signed)
 I attempted to call the patient about using his device as instructed here and his mailbox is full.

## 2024-03-31 NOTE — Telephone Encounter (Signed)
 Patient has an order for a home sleep study that was written by Dr Neysa. Could you please write a new order? He is also an inspire patient. Thank you

## 2024-03-31 NOTE — Telephone Encounter (Signed)
 Called home phone and left a vm

## 2024-04-13 ENCOUNTER — Encounter (HOSPITAL_BASED_OUTPATIENT_CLINIC_OR_DEPARTMENT_OTHER)

## 2025-02-28 ENCOUNTER — Encounter: Admitting: Family Medicine
